# Patient Record
Sex: Male | Born: 1960 | Race: White | Hispanic: No | Marital: Single | Smoking: Never smoker
Health system: Southern US, Community
[De-identification: ages and names within clinical notes are randomized; demographics above are authoritative.]

## PROBLEM LIST (undated history)

## (undated) ENCOUNTER — Emergency Department (HOSPITAL_BASED_OUTPATIENT_CLINIC_OR_DEPARTMENT_OTHER): Payer: Medicare Other | Source: Home / Self Care

## (undated) DIAGNOSIS — E119 Type 2 diabetes mellitus without complications: Secondary | ICD-10-CM

## (undated) DIAGNOSIS — I2510 Atherosclerotic heart disease of native coronary artery without angina pectoris: Secondary | ICD-10-CM

## (undated) DIAGNOSIS — E663 Overweight: Secondary | ICD-10-CM

## (undated) DIAGNOSIS — E785 Hyperlipidemia, unspecified: Secondary | ICD-10-CM

## (undated) DIAGNOSIS — I10 Essential (primary) hypertension: Secondary | ICD-10-CM

## (undated) DIAGNOSIS — E039 Hypothyroidism, unspecified: Secondary | ICD-10-CM

## (undated) HISTORY — DX: Type 2 diabetes mellitus without complications: E11.9

## (undated) HISTORY — DX: Hyperlipidemia, unspecified: E78.5

## (undated) HISTORY — DX: Overweight: E66.3

## (undated) HISTORY — DX: Atherosclerotic heart disease of native coronary artery without angina pectoris: I25.10

## (undated) HISTORY — DX: Essential (primary) hypertension: I10

## (undated) HISTORY — PX: CORONARY ARTERY BYPASS GRAFT: SHX141

## (undated) HISTORY — DX: Hypothyroidism, unspecified: E03.9

## (undated) HISTORY — PX: ENUCLEATION: SHX628

---

## 1999-06-12 ENCOUNTER — Emergency Department (HOSPITAL_COMMUNITY): Admission: EM | Admit: 1999-06-12 | Discharge: 1999-06-12 | Payer: Self-pay | Admitting: Emergency Medicine

## 1999-06-12 ENCOUNTER — Encounter: Payer: Self-pay | Admitting: Emergency Medicine

## 2000-04-15 ENCOUNTER — Encounter: Admission: RE | Admit: 2000-04-15 | Discharge: 2000-05-30 | Payer: Self-pay | Admitting: Specialist

## 2000-07-04 ENCOUNTER — Encounter: Admission: RE | Admit: 2000-07-04 | Discharge: 2000-09-06 | Payer: Self-pay | Admitting: Specialist

## 2000-07-11 ENCOUNTER — Emergency Department (HOSPITAL_COMMUNITY): Admission: EM | Admit: 2000-07-11 | Discharge: 2000-07-11 | Payer: Self-pay | Admitting: Emergency Medicine

## 2000-07-11 ENCOUNTER — Encounter: Payer: Self-pay | Admitting: Emergency Medicine

## 2000-07-18 ENCOUNTER — Encounter: Payer: Self-pay | Admitting: Emergency Medicine

## 2000-07-18 ENCOUNTER — Emergency Department (HOSPITAL_COMMUNITY): Admission: EM | Admit: 2000-07-18 | Discharge: 2000-07-18 | Payer: Self-pay | Admitting: Emergency Medicine

## 2000-09-17 ENCOUNTER — Ambulatory Visit (HOSPITAL_COMMUNITY): Admission: EM | Admit: 2000-09-17 | Discharge: 2000-09-17 | Payer: Self-pay | Admitting: Emergency Medicine

## 2000-09-17 ENCOUNTER — Encounter: Payer: Self-pay | Admitting: Endocrinology

## 2000-10-13 ENCOUNTER — Emergency Department (HOSPITAL_COMMUNITY): Admission: EM | Admit: 2000-10-13 | Discharge: 2000-10-14 | Payer: Self-pay | Admitting: Emergency Medicine

## 2000-11-08 ENCOUNTER — Encounter: Admission: RE | Admit: 2000-11-08 | Discharge: 2001-02-06 | Payer: Self-pay | Admitting: *Deleted

## 2001-05-20 ENCOUNTER — Encounter: Admission: RE | Admit: 2001-05-20 | Discharge: 2001-06-03 | Payer: Self-pay | Admitting: Specialist

## 2001-07-11 ENCOUNTER — Emergency Department (HOSPITAL_COMMUNITY): Admission: EM | Admit: 2001-07-11 | Discharge: 2001-07-12 | Payer: Self-pay | Admitting: Emergency Medicine

## 2001-07-17 ENCOUNTER — Encounter: Admission: RE | Admit: 2001-07-17 | Discharge: 2001-07-17 | Payer: Self-pay | Admitting: Endocrinology

## 2001-07-17 ENCOUNTER — Encounter: Payer: Self-pay | Admitting: Endocrinology

## 2001-08-29 ENCOUNTER — Encounter: Payer: Self-pay | Admitting: Specialist

## 2001-08-29 ENCOUNTER — Ambulatory Visit (HOSPITAL_COMMUNITY): Admission: RE | Admit: 2001-08-29 | Discharge: 2001-08-29 | Payer: Self-pay | Admitting: *Deleted

## 2001-09-11 ENCOUNTER — Encounter: Admission: RE | Admit: 2001-09-11 | Discharge: 2001-10-09 | Payer: Self-pay | Admitting: *Deleted

## 2002-05-05 ENCOUNTER — Encounter: Admission: RE | Admit: 2002-05-05 | Discharge: 2002-06-25 | Payer: Self-pay | Admitting: *Deleted

## 2003-12-19 ENCOUNTER — Emergency Department (HOSPITAL_COMMUNITY): Admission: EM | Admit: 2003-12-19 | Discharge: 2003-12-19 | Payer: Self-pay | Admitting: *Deleted

## 2003-12-30 ENCOUNTER — Ambulatory Visit (HOSPITAL_COMMUNITY): Admission: RE | Admit: 2003-12-30 | Discharge: 2003-12-30 | Payer: Self-pay | Admitting: Orthopedic Surgery

## 2003-12-30 ENCOUNTER — Ambulatory Visit (HOSPITAL_BASED_OUTPATIENT_CLINIC_OR_DEPARTMENT_OTHER): Admission: RE | Admit: 2003-12-30 | Discharge: 2003-12-30 | Payer: Self-pay | Admitting: Orthopedic Surgery

## 2004-03-08 ENCOUNTER — Emergency Department (HOSPITAL_COMMUNITY): Admission: EM | Admit: 2004-03-08 | Discharge: 2004-03-08 | Payer: Self-pay | Admitting: Emergency Medicine

## 2004-04-30 ENCOUNTER — Emergency Department (HOSPITAL_COMMUNITY): Admission: EM | Admit: 2004-04-30 | Discharge: 2004-04-30 | Payer: Self-pay | Admitting: Emergency Medicine

## 2004-05-16 ENCOUNTER — Emergency Department (HOSPITAL_COMMUNITY): Admission: EM | Admit: 2004-05-16 | Discharge: 2004-05-16 | Payer: Self-pay | Admitting: Emergency Medicine

## 2005-08-16 ENCOUNTER — Ambulatory Visit: Payer: Self-pay | Admitting: Cardiology

## 2005-08-16 ENCOUNTER — Inpatient Hospital Stay (HOSPITAL_COMMUNITY): Admission: EM | Admit: 2005-08-16 | Discharge: 2005-08-18 | Payer: Self-pay | Admitting: Emergency Medicine

## 2005-08-27 ENCOUNTER — Ambulatory Visit: Payer: Self-pay | Admitting: Cardiology

## 2005-08-30 ENCOUNTER — Ambulatory Visit: Payer: Self-pay | Admitting: Cardiology

## 2005-10-11 ENCOUNTER — Emergency Department (HOSPITAL_COMMUNITY): Admission: EM | Admit: 2005-10-11 | Discharge: 2005-10-11 | Payer: Self-pay | Admitting: Emergency Medicine

## 2005-10-25 ENCOUNTER — Encounter (HOSPITAL_COMMUNITY): Admission: RE | Admit: 2005-10-25 | Discharge: 2005-12-29 | Payer: Self-pay | Admitting: Cardiology

## 2005-12-13 ENCOUNTER — Encounter: Payer: Self-pay | Admitting: Cardiology

## 2005-12-13 ENCOUNTER — Encounter: Payer: Self-pay | Admitting: Vascular Surgery

## 2005-12-13 ENCOUNTER — Inpatient Hospital Stay (HOSPITAL_COMMUNITY): Admission: EM | Admit: 2005-12-13 | Discharge: 2005-12-22 | Payer: Self-pay | Admitting: Emergency Medicine

## 2005-12-13 ENCOUNTER — Ambulatory Visit: Payer: Self-pay | Admitting: Cardiology

## 2005-12-14 ENCOUNTER — Encounter: Payer: Self-pay | Admitting: Vascular Surgery

## 2006-01-01 ENCOUNTER — Ambulatory Visit: Payer: Self-pay | Admitting: Internal Medicine

## 2006-01-10 ENCOUNTER — Encounter (HOSPITAL_COMMUNITY): Admission: RE | Admit: 2006-01-10 | Discharge: 2006-04-10 | Payer: Self-pay | Admitting: Cardiology

## 2006-02-26 ENCOUNTER — Ambulatory Visit: Payer: Self-pay | Admitting: Cardiology

## 2006-04-12 ENCOUNTER — Encounter (HOSPITAL_COMMUNITY): Admission: RE | Admit: 2006-04-12 | Discharge: 2006-05-24 | Payer: Self-pay | Admitting: Cardiology

## 2007-02-28 ENCOUNTER — Ambulatory Visit: Payer: Self-pay | Admitting: Cardiology

## 2008-07-07 DIAGNOSIS — I2510 Atherosclerotic heart disease of native coronary artery without angina pectoris: Secondary | ICD-10-CM

## 2008-07-07 DIAGNOSIS — E119 Type 2 diabetes mellitus without complications: Secondary | ICD-10-CM | POA: Insufficient documentation

## 2008-07-07 DIAGNOSIS — I10 Essential (primary) hypertension: Secondary | ICD-10-CM

## 2008-07-07 DIAGNOSIS — E785 Hyperlipidemia, unspecified: Secondary | ICD-10-CM | POA: Insufficient documentation

## 2008-07-07 DIAGNOSIS — E039 Hypothyroidism, unspecified: Secondary | ICD-10-CM | POA: Insufficient documentation

## 2008-07-07 HISTORY — DX: Type 2 diabetes mellitus without complications: E11.9

## 2008-07-07 HISTORY — DX: Atherosclerotic heart disease of native coronary artery without angina pectoris: I25.10

## 2008-07-07 HISTORY — DX: Hypothyroidism, unspecified: E03.9

## 2008-07-07 HISTORY — DX: Essential (primary) hypertension: I10

## 2008-07-07 HISTORY — DX: Hyperlipidemia, unspecified: E78.5

## 2008-07-08 ENCOUNTER — Ambulatory Visit: Payer: Self-pay | Admitting: Cardiology

## 2008-07-08 DIAGNOSIS — E663 Overweight: Secondary | ICD-10-CM

## 2008-07-08 HISTORY — DX: Overweight: E66.3

## 2008-09-29 ENCOUNTER — Encounter (INDEPENDENT_AMBULATORY_CARE_PROVIDER_SITE_OTHER): Payer: Self-pay | Admitting: *Deleted

## 2009-03-15 ENCOUNTER — Encounter: Payer: Self-pay | Admitting: Cardiology

## 2009-03-25 ENCOUNTER — Encounter: Payer: Self-pay | Admitting: Cardiology

## 2010-01-10 ENCOUNTER — Ambulatory Visit: Payer: Self-pay | Admitting: Cardiology

## 2010-03-24 ENCOUNTER — Encounter: Payer: Self-pay | Admitting: Cardiology

## 2010-04-11 NOTE — Assessment & Plan Note (Signed)
Summary: 14month f/u per pt call/lg   Visit Type:  Follow-up Primary Provider:  Dr. Evlyn Kanner  CC:  CAD.  History of Present Illness: The patient presents for followup of his known coronary disease. He has done well since I last saw him. I reviewed lipids done earlier this year and they were excellent with an HDL of 50 and an LDL of 33. He has had no chest discomfort, neck or arm discomfort. He has had none of the nausea or stomach upset there was his angina. He has had no new shortness of breath, PND or orthopnea. He is not describing palpitations, presyncope or syncope. Unfortunately he is not exercising despite the fact that he has a treadmill and exercise bike in his house.  Current Medications (verified): 1)  Gabapentin 600 Mg Tabs (Gabapentin) .... Take 1 Tablet By Mouth Four Times A Day 2)  Aspirin 81 Mg Tbec (Aspirin) .... Take One Tablet By Mouth Daily 3)  Bupropion Hcl 300 Mg Xr24h-Tab (Bupropion Hcl) .... Take 1 Tablet By Mouth Once A Day 4)  Metoprolol Tartrate 50 Mg Tabs (Metoprolol Tartrate) .... Take 1 Tablet By Mouth Two Times A Day 5)  Benazepril Hcl 10 Mg Tabs (Benazepril Hcl) .... Take 1 Tablet By Mouth Once A Day 6)  Lipitor 80 Mg Tabs (Atorvastatin Calcium) .... Take One Tablet By Mouth Daily. 7)  Synthroid 125 Mcg Tabs (Levothyroxine Sodium) .Marland Kitchen.. 1 By Mouth Daily 8)  Centrum Silver  Tabs (Multiple Vitamins-Minerals) .... Take 1 Tablet By Mouth Once A Day 9)  Lantus 100 Unit/ml Soln (Insulin Glargine) .... As Directed 10)  Novolog Sliding Scale .... As Directed  Allergies (verified): 1)  ! Cipro 2)  ! Penicillin  Past History:  Past Medical History: Reviewed history from 07/07/2008 and no changes required. 1. Coronary artery disease (coronary artery bypass grafting December 13, 2005 with a left internal mammary artery to the left anterior       descending, saphenous vein graft to posterior descending artery,       saphenous vein graft to obtuse marginal, and  saphenous vein graft       to diagonal).   2. Mildly reduced ejection fraction (ejection fraction about 45%).   3. Diabetes mellitus since the age of 7.   4. Hypertension.   5. Dyslipidemia.   6. Hypothyroidism.   Past Surgical History: Laser therapy for diabetic retinopathy Left enuclation July 2009 CABG  Review of Systems       As stated in the HPI and negative for all other systems.   Vital Signs:  Patient profile:   50 year old male Height:      71 inches Weight:      233 pounds BMI:     32.61 Pulse rate:   68 / minute Resp:     18 per minute BP sitting:   126 / 82  (right arm)  Vitals Entered By: Marrion Coy, CNA (January 10, 2010 9:23 AM)  Physical Exam  General:  Well developed, well nourished, in no acute distress. Head:  normocephalic and atraumatic Mouth:  Teeth, gums and palate normal. Oral mucosa normal. Neck:  Neck supple, no JVD. No masses, thyromegaly or abnormal cervical nodes. Chest Wall:  well-healed sternotomy scar Lungs:  Clear bilaterally to auscultation and percussion. Heart:  Non-displaced PMI, chest non-tender; regular rate and rhythm, S1, S2 without murmurs, rubs or gallops. Carotid upstroke normal, no bruit. Normal abdominal aortic size, no bruits.  Femorals normal pulses, no bruits. Pedals normal pulses. Trace edema, no varicosities. Abdomen:  Bowel sounds positive; abdomen soft and non-tender without masses, organomegaly, or hernias noted. No hepatosplenomegaly, obese Msk:  Back normal, normal gait. Muscle strength and tone normal. Extremities:  mild bilateral edema Neurologic:  Alert and oriented x 3. Skin:  Intact without lesions or rashes. Cervical Nodes:  no significant adenopathy Inguinal Nodes:  no significant adenopathy Psych:  Normal affect.    EKG  Procedure date:  01/10/2010  Findings:      Sinus rhythm, rate 69, right axis deviation, poor anterior R-wave progression, nonspecific T-wave flattening  Impression &  Recommendations:  Problem # 1:  CAD (ICD-414.00) He has had no new cardiovascular symptoms. He is anticipating for the most part in good risk reduction. He has close followup by Dr. Evlyn Kanner. At this point no further cardiovascular testing is suggested though I probably will perform an exercise treadmill when I see him next as his bypass grafts will be over 74 years old. We also discussed any symptoms that could prompt further cardiac work up sooner than this. Orders: EKG w/ Interpretation (93000)  Problem # 2:  OVERWEIGHT (ICD-278.02) We had a long discussion about weight loss with specifics on diet and exercise. He understands his body mass index puts him in the obese range.  Problem # 3:  HYPERTENSION (ICD-401.9) His blood pressure is controlled. He will continue the meds as listed.  Patient Instructions: 1)  Your physician recommends that you schedule a follow-up appointment in: 18 months with Dr Antoine Poche:  you should receive a reminder 2 months prior to your due date.  Please call to schedule. 2)  Your physician recommends that you continue on your current medications as directed. Please refer to the Current Medication list given to you today.

## 2010-04-28 ENCOUNTER — Emergency Department (HOSPITAL_BASED_OUTPATIENT_CLINIC_OR_DEPARTMENT_OTHER)
Admission: EM | Admit: 2010-04-28 | Discharge: 2010-04-28 | Disposition: A | Discharge status: Home / Self Care | Payer: Medicare Other | Attending: Emergency Medicine | Admitting: Emergency Medicine

## 2010-04-28 ENCOUNTER — Emergency Department (INDEPENDENT_AMBULATORY_CARE_PROVIDER_SITE_OTHER): Payer: Medicare Other

## 2010-04-28 DIAGNOSIS — R51 Headache: Secondary | ICD-10-CM | POA: Insufficient documentation

## 2010-04-28 DIAGNOSIS — G8929 Other chronic pain: Secondary | ICD-10-CM | POA: Insufficient documentation

## 2010-04-28 DIAGNOSIS — E119 Type 2 diabetes mellitus without complications: Secondary | ICD-10-CM | POA: Insufficient documentation

## 2010-04-28 DIAGNOSIS — I10 Essential (primary) hypertension: Secondary | ICD-10-CM | POA: Insufficient documentation

## 2010-04-28 DIAGNOSIS — E7800 Pure hypercholesterolemia, unspecified: Secondary | ICD-10-CM | POA: Insufficient documentation

## 2010-04-28 DIAGNOSIS — Z79899 Other long term (current) drug therapy: Secondary | ICD-10-CM | POA: Insufficient documentation

## 2010-04-28 DIAGNOSIS — H5440 Blindness, one eye, unspecified eye: Secondary | ICD-10-CM | POA: Insufficient documentation

## 2010-04-28 DIAGNOSIS — K029 Dental caries, unspecified: Secondary | ICD-10-CM | POA: Insufficient documentation

## 2010-04-28 LAB — DIFFERENTIAL
Basophils Absolute: 0 10*3/uL (ref 0.0–0.1)
Lymphocytes Relative: 18 % (ref 12–46)
Lymphs Abs: 1.5 10*3/uL (ref 0.7–4.0)
Monocytes Absolute: 0.5 10*3/uL (ref 0.1–1.0)
Neutro Abs: 5.8 10*3/uL (ref 1.7–7.7)

## 2010-04-28 LAB — BASIC METABOLIC PANEL
CO2: 28 mEq/L (ref 19–32)
Glucose, Bld: 259 mg/dL — ABNORMAL HIGH (ref 70–99)
Potassium: 4.5 mEq/L (ref 3.5–5.1)
Sodium: 141 mEq/L (ref 135–145)

## 2010-04-28 LAB — CBC
HCT: 40.5 % (ref 39.0–52.0)
Hemoglobin: 14.2 g/dL (ref 13.0–17.0)
MCHC: 35.1 g/dL (ref 30.0–36.0)
WBC: 8.2 10*3/uL (ref 4.0–10.5)

## 2010-07-25 NOTE — Assessment & Plan Note (Signed)
[facility]                            CARDIOLOGY OFFICE NOTE   NAME:[name], [name]                       MRN:          [id number]  DATE:02/28/2007                            DOB:          Jul 02, 1960    PRIMARY CARE PHYSICIAN:  Dr. Adrian Prince.   REASON FOR PRESENTATION:  Evaluate patient with coronary disease, status  post coronary artery bypass grafting.   HISTORY OF PRESENT ILLNESS:  The patient returns for 75-month followup.  He is 50 years old.  Since I last saw him, he has had no cardiovascular  problems.  He said he was not exercising as much as he used to because  he was battling depression.  However, this seems to be better treated  now.  He is starting to walk.  He is trying to walk a half hour a day.  With this, he denies any chest discomfort, neck or arm discomfort.  He  has had no palpitations, presyncope, or syncope.  He has had no PND or  orthopnea.  He had his lipids checked by Dr. Evlyn Kanner recently and says  they were excellent.   PAST MEDICAL HISTORY:  1. Coronary artery disease (coronary artery bypass grafting December 13, 2005 with a left internal mammary artery to the left anterior      descending, saphenous vein graft to posterior descending artery,      saphenous vein graft to obtuse marginal, and saphenous vein graft      to diagonal).  2. Mildly reduced ejection fraction (ejection fraction about 45%).  3. Diabetes mellitus since the age of 7.  4. Hypertension.  5. Dyslipidemia.  6. Hypothyroidism.  7. 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  8. Laser therapy for diabetic retinopathy.  9. Shoulder adhesive capsulitis.   ALLERGIES:  PENICILLIN.   MEDICATIONS:  1. Gabapentin 600 mg q.i.d.  2. Synthroid 100 mcg daily.  3. Multivitamin.  4. Lantus.  5. Metoprolol 50 mg b.i.d.  6. Lipitor 80 mg nightly.  7. Aspirin 81 mg daily.  8. Bupropion 300 mg daily.  9. Benazepril 10 mg daily.   REVIEW OF SYSTEMS:  As stated in the HPI and otherwise negative for  other systems.   PHYSICAL EXAMINATION:  The patient is in no distress.  Blood pressure 121/78, heart rate 75 and regular, weight 211 pounds,  body mass index 27.  NECK:  No jugular venous distension at 45 degrees, carotid upstroke  brisk and symmetrical.  No bruit.  No thyromegaly.  LYMPHATICS:  No adenopathy.  LUNGS:  Clear to auscultation bilaterally.  CHEST:  Well-healed sternotomy scar.  HEART:  PMI not displaced or sustained.  S1 and S2 are within normal  limits.  No S3, no S4.  No clicks, no rubs, no murmurs.  ABDOMEN:  Obese, positive bowel sounds, normal in frequency and pitch.  No bruits, no rebound, no guarding.  No midline pulsatile mass.  No  organomegaly.  SKIN:  No rashes, no nodules.  EXTREMITIES:  2+ pulses, no edema.   EKG sinus rhythm, rate  75, right axis deviation, limb lead reversal, old  anteroseptal myocardial infarction, intervals within normal limits, no  acute ST-T wave changes.   ASSESSMENT AND PLAN:  1. Coronary disease:  The patient is having no symptoms.  He has done      well since surgery.  He is having secondary risk reduction.  No      further cardiovascular testing is suggested.  2. Dyslipidemia:  Per Dr. Evlyn Kanner.  3. Followup:  I will see the patient back in 18 months or sooner if      needed.     Rollene Rotunda, MD, [facility]  Electronically Signed    JH/MedQ  DD: 02/28/2007  DT: 03/01/2007  Job #: [id number]

## 2010-07-28 NOTE — Op Note (Signed)
NAME:  [name], [name]                ACCOUNT NO.:  [id number]   MEDICAL RECORD NO.:  [id number]          PATIENT TYPE:  AMB   LOCATION:  DSC                          FACILITY:  [facility]   PHYSICIAN:  Katy Fitch. Sypher Montez Hageman., M.D.DATE OF BIRTH:  09-18-1960   DATE OF PROCEDURE:  12/30/2003  DATE OF DISCHARGE:                                 OPERATIVE REPORT   PREOPERATIVE DIAGNOSIS:  Chronic entrapment neuropathy median nerve, right  carpal tunnel.   POSTOPERATIVE DIAGNOSIS:  Chronic entrapment neuropathy median nerve, right  carpal tunnel.   OPERATION:  Release of right transverse carpal ligament.   SURGEON:  Katy Fitch. Sypher, M.D.   ASSISTANT:  Jonni Sanger, P.A.-C.   ANESTHESIA:  General by LMA, anesthesiologist Dr. Krista Blue.   INDICATIONS:  [name] is a 50 year old right hand dominant gentleman  with long-standing insulin-dependent diabetes mellitus.  He was referred by  Dr. Evlyn Kanner for evaluation and management of hand pain and numbness consistent  with carpal tunnel syndrome.   Clinical examination revealed signs of loss of sensibility in the median  distribution of the right hand consistent with carpal tunnel syndrome  superimposed on a background diabetic neuropathy.   Dr. Johna Roles performed detailed electrodiagnostic studies which revealed  evidence of rather significant right carpal tunnel syndrome.   Due to a failure to respond to nonoperative measures, he was brought to the  operating room at this time for release of his right transverse carpal  ligament.   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  It is likely that he will  have issues with scar formation and even the development of a Dupuytren's  contracture following surgery given his background palmar fibromatosis.   DESCRIPTION OF PROCEDURE:  [name] was brought to the operating room  and placed in the supine position on  the operating table.  Following  induction of general anesthesia by LMA, the right arm was prepped with  Betadine soap and solution and sterilely draped.   Following exsanguination of the right arm with an Esmarch bandage, an  arterial tourniquet on the proximal brachium was inflated to 220 mmHg.   The procedure commenced with a short incision in the line of the ring finger  of the palm.  The subcutaneous tissues were carefully divided to reveal the  palmar fascia.  This was split longitudinally to the commissure branch of  the median nerve.  These were followed back to the transverse carpal  ligament which was then carefully isolated from the median nerve.   The ligament was released with scissors along its ulnar border, releasing  rather dramatic tension in the carpal canal.  The ulnar bursa was quite  fibrotic.   Bleeding points were electrocauterized with bipolar current, followed by  repair of the skin with intradermal 3-0 Prolene suture.   A compressive dressing was applied with a volar plaster splint, maintaining  the wrist in 5 degrees of dorsiflexion.   There were no apparent complications.   [name] tolerated surgery and anesthesia well.  He was  transferred to the  recovery room with stable vital signs.   He will be discharged with a prescription for Percocet 5 mg one tablet p.o.  q.4-6h. p.r.n. pain, a total of 20 tablets without refill.  He was also  given Levaquin 500 mg one p.o. daily x4 days as a prophylactic antibiotic.      Robe   RVS/MEDQ  D:  12/30/2003  T:  12/30/2003  Job:  [id number]

## 2010-07-28 NOTE — Consult Note (Signed)
NAME:  [name], [name] NO.:  [id number]   MEDICAL RECORD NO.:  [id number]          PATIENT TYPE:  INP   LOCATION:  2013                         FACILITY:  [facility]   PHYSICIAN:  Kerin Perna, M.D.  DATE OF BIRTH:  1960/10/03   DATE OF PROCEDURE:  12/13/2005  DATE OF DISCHARGE:                      STAT - MUST CHANGE TO CORRECT WORK TYPE   PRIMARY CARE PHYSICIAN:  Tera Mater. Evlyn Kanner, M.D.   CONSULTANT:  Kerin Perna, M.D.   REASON FOR CONSULTATION:  Acute subendocardial myocardial infarction with  severe 3-vessel coronary artery disease and unstable angina.   CHIEF COMPLAINT:  Chest pain.   HISTORY OF PRESENT ILLNESS:  I was asked to evaluate this 50 year old white  male diabetic for potential surgical coronary revascularization for known  severe 3-vessel coronary artery disease with recent acute subendocardial MI.  The patient was hospitalized with some anginal symptoms last summer and  cardiac cath demonstrated 3-vessel disease with nonobstructive disease of  the LAD. For that reason medical therapy was recommended. The patient  presented yesterday with sustained chest pain that lasted for several hours.  It was in the anterior chest with radiation to the shoulders and similar to  the pain he had on his presentation earlier last summer.  He did developed  associated nausea and emesis with a headache and dizziness and weakness. The  patient presented to the emergency department where he was found to have  positive cardiac enzymes and his CPK-MB increased from 30.7 to 160  nanogram/mL. He was admitted and placed on IV heparin and a 2D echo was  performed showing anterior apical hypokinesia. He subsequently underwent  cardiac catheterization today because he ruled in for MI, and this  demonstrated progression in his coronary disease with a 70-80% stenosis of  the LAD with a high-grade proximal stenosis of the ramus intermedius and a  high-grade stenosis of the  distal right coronary of 90%. LVEDP was 26 and  ejection fraction was 45%.  There was no evidence of mitral regurgitation on  the echo or the LV gram. Because of his anatomy and recent rule in for MI he  was felt to be a potential candidate for surgical revascularization.   PAST MEDICAL HISTORY:  1. Insulin-dependent diabetes.  2. Hypertension.  3. Hypothyroidism.  4. Blindness in the left eye from retinal hemorrhage.  5. 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  6. ALLERGY TO PENICILLIN - which causes a rash.   HOME MEDICATIONS:  1. Aspirin 81 mg.  2. Lopressor 50 b.i.d.  3. Pravachol 80 mg q.h.s.  4. Nitroglycerin p.r.n.  5. Lotensin 120 mg daily.  6. Neurontin 600 mg q.a.m., 1200 mg 1 p.m., 600 mg at h.s.  7. Synthroid 100 mcg daily.  8. Insulin NPH 22 units q.a.m., 20 units q.p.m.  9. Lantus insulin 38 units q.h.s.   SOCIAL HISTORY:  The patient is disabled and lives by himself. He recently  lost his father which has caused a significant emotional affect. He does not  smoke cigarettes or use alcohol.  He has other siblings. He is able to  drive.  FAMILY HISTORY:  Positive for diabetes. Negative for premature coronary  artery disease although his father did have cardiac arrhythmias prior to his  death.   REVIEW OF SYSTEMS:  CONSTITUTIONAL - review is negative for fever, weight  loss.  ENT - review is positive for his retinal disease and retinal surgery  bilaterally.  He does not see his dentist regularly.  He denies any difficulty swallowing.  THORACIC - review is negative for a history of abnormal chest x-ray,  pulmonary nodule or significant thoracic trauma.  CARDIAC -  review is  positive for his severe coronary disease and recent subendocardial MI,  anterior apical.  GI - review is positive for GERD, negative for gallstones,  hepatitis, jaundice or blood per rectum.  UROLOGIC- review is negative for  BPH or hematuria.  ENDOCRINE - review is positive for diabetes and   hypothyroidism. VASCULAR -  review is negative for DVT, claudication or TIA.  NEUROLOGIC - review is negative for stroke or seizure.  HEMATOLOGIC - review  is negative for bleeding disorder or prior blood transfusion.   PHYSICAL EXAMINATION:  VITAL SIGNS:  The patient is 5 feet, 10 inches and  weight is 200 pounds.  Blood pressure is 130/60, pulse 70, respirations 18.  GENERAL APPEARANCE:  Is that of a chronically ill white male in his hospital  room following cardiac catheterization, in no distress.  HEENT EXAM:  Normocephalic. Poor vision and blind in the left eye. Dentition  adequate.  NECK:  Without JVD, mass or carotid bruit.  LYMPHATICS:  Reveal no palpable supraclavicular or axillary adenopathy.  CHEST:  His breath sounds are clear and equal and there is no thoracic  deformity.  CARDIAC EXAM:  Reveals a regular rhythm without an S3 gallop or a murmur.  ABDOMEN:  Soft without pulsatile mass or focal tenderness.  EXTREMITIES:  Show no clubbing or cyanosis with 1+ ankle edema.  Peripheral  pulses are 2+ in the upper extremities and femoral's, 1+ pedal pulses.  NEUROLOGIC EXAM:  Alert and oriented without focal motor deficit. His gait  is not examined at this time since he is at bedrest following cardiac  catheterization.   LABORATORY DATA:  I reviewed the coronary arteriograms, LV gram and 2-D  echocardiogram. The patient has severe diabetic pattern coronary disease  with a recent myocardial infarction. He has moderate reduction of LV  function.   PLAN:  The patient would be a candidate for a high risk surgical  revascularization due to his poor targets from diffuse diabetic pattern  disease. However this is his best option.  Surgery will be scheduled for  Monday, December 17, 2005.  I reviewed the situation and operation with the  patient and at this time he is agreeable to proceed.   Thank you for the consultation.      Kerin Perna, M.D.  Electronically  Signed    PV/MEDQ  D:  12/13/2005  T:  12/13/2005  Job:  [id number]   cc:   Jeannett Senior A. Evlyn Kanner, M.D.

## 2010-07-28 NOTE — Assessment & Plan Note (Signed)
[facility]                              CARDIOLOGY OFFICE NOTE   NAME:[name]                       MRN:          [id number]  DATE:01/01/2006                            DOB:          August 25, 1960    This is a 50 year old white male patient, who presented with acute  subendocardial MI and was found to have severe three-vessel coronary artery  disease and underwent CABG x4 on December 17, 2005.  He had a LIMA to the LAD,  SVG to the PDA, SVG to the OM and SVG to the diagonal.  Ejection fraction  preop was 45% with anterior apical hypokinesis.  The patient did well  postop.  His biggest problem is his insulin-dependent diabetes mellitus and  getting his sugars back under control.  He has some chest soreness but  overall he is not having significant pain, shortness of breath, dizziness or  pre-syncope.   CURRENT MEDICATIONS:  1. Gabapentin 600 mg q.i.d.  2. Benazepril HCL 120 mg daily.  3. Synthroid 100 mcg daily.  4. Multivitamin daily.  5. Lantus Humulin and Humalog insulin.  6. Metoprolol 50 mg b.i.d.  7. Lipitor 80 mg q.h.s.  8. Lexapro 20 mg daily.  9. Plavix 75 mg daily.   PHYSICAL EXAMINATION:  GENERAL:  A pleasant 50 year old white male in no  acute distress.  VITAL SIGNS:  Blood pressure 100/64.  Pulse 81.  NECK:  Without JVD, HR, bruit or thyroid enlargement.  LUNGS:  Clear anterior, posterior and lateral.  HEART:  Regular rate and rhythm at 81 beats per minute.  Normal S1 and S2.  Positive S4.  No significant murmur heard.  ABDOMEN:  Soft without organomegaly, masses, lesions or abnormal tenderness.  CHEST:  Incision is healing well.  EXTREMITIES:  Without cyanosis or clubbing.  He has mild left ankle edema.   EKG normal sinus rhythm with inferior Q-waves and poor R-wave progression.  Nonspecific ST-T changes.   IMPRESSION:  1. Coronary artery disease, status post coronary artery bypass grafting x4      on December 17, 2005  after subendocardial myocardial infarction.  2. Insulin-dependent diabetes mellitus with diabetic retinopathy,      neuropathy.  3. Hypertension.  4. Hypothyroidism.  5. Hyperlipidemia.  6. History of blindness of the left eye secondary to retinal hemorrhage.  7. Shoulder adhesive capsulitis.  8. Carpal tunnel syndrome.   PLAN:  At this time, the patient is doing well from a cardiac standpoint.  He sees the surgeons back on January 11, 2006 and will follow up with Dr.  Antoine [name] in 1-2 months.     ______________________________  Jacolyn Reedy, PA-C    ______________________________  Bevelyn Buckles. Bensimhon, MD   ML/MedQ  DD: 01/01/2006  DT: 01/02/2006  Job #: [id number]   cc:   Kerin Perna, M.D.  Tera Mater. Evlyn Kanner, M.D.

## 2010-07-28 NOTE — Cardiovascular Report (Signed)
NAME:  FRIEND, DORFMAN NO.:  [id number]   MEDICAL RECORD NO.:  [id number]          PATIENT TYPE:  INP   LOCATION:  2013                         FACILITY:  [facility]   PHYSICIAN:  Veverly Fells. Excell Seltzer, MD  DATE OF BIRTH:  March 08, 1961   DATE OF PROCEDURE:  12/13/2005  DATE OF DISCHARGE:                              CARDIAC CATHETERIZATION   PROCEDURES:  1. Left heart catheterization.  2. Selective coronary angiography.  3. Left ventricular angiography.  4. Angio-Seal of the right femoral artery.   INDICATIONS:  [name] is a very pleasant, 50 year old man with severe 3-  vessel coronary artery disease, who underwent a cardiac catheterization in  June of 2007.  Because he has diffuse involvement of all 3 coronaries, he  was treated medically.  He had done fairly well with medical therapy until 2  days prior to admission when he developed chest pain.  He presented for  evaluation and ruled in for myocardial infarction.  He was subsequently  referred for cardiac catheterization.   PROCEDURAL DETAILS:  Risks and indications of the procedure were explained  to the patient.  Informed consent was obtained.  Under normal sterile  conditions, the right groin was prepped, draped and anesthetized with 1%  lidocaine.  Using the modified Seldinger technique a 6-French arterial  sheath was placed in the right femoral artery.  Multiple angiographic views  were taken of both the left and right coronary arteries.  For the left  coronary artery, a JL4 catheter was used.  For the right coronary artery, a  JR4 catheter was used.  Following selective coronary angiography, a pigtail  catheter was inserted into the left ventricle and left ventricular pressures  were recorded.  A 30-degree, right anterior oblique left ventriculogram was  performed.  Following left ventriculography a pullback across the aortic  valve was performed.  All catheter exchanges were performed a guidewire.  At  the  conclusion of the case, a 6-French Angio-Seal closure device was used to  close the right common femoral arteriotomy site.   FINDINGS:  Aortic pressure 100/59 with mean of 78.  Left ventricular  pressure 102/16 with an end-diastolic pressure of 26.   The left mainstem has nonobstructive plaque disease.  It bifurcates into the  LAD and left circumflex.  The left mainstem is mildly calcified.   Left anterior descending is moderately calcified throughout its proximal and  mid segments.  There is diffuse disease throughout the LAD most  significantly in the proximal and mid vessel.  There is a very long area of  disease with 70% serial stenoses throughout.  The LAD is a medium caliber  vessel distally and courses around to supply the left ventricular apex.  There are 2 diagonal branches in the mid LAD that are diffusely diseased.   The left circumflex is calcified. There is nonobstructive disease throughout  the proximal left circumflex.  There is a first obtuse marginal that covers  the territory of a ramus intermedius.  This vessel has high-grade disease  throughout its proximal segments in the range of 90%.  Distally  this is a  medium caliber vessel that bifurcates into twin vessels.   There is a second obtuse marginal that is subtotally occluded.  The true  circumflex courses down the A-V groove and gives off 3 medium-caliber  posterolateral branches, all of which have diffuse moderate disease.   The right coronary artery is co-dominant.  There is a very large RV marginal  branch that has mild diffuse disease. The mid right coronary artery has an  80% lesion in its mid segment.  It is then subtotally occluded in its distal  segment just prior to the PDA.  The PDA has diffuse disease including an 80%  focal stenosis.  There is a right posterolateral branch that is subtotally  occluded.   Left ventriculography performed in the 30-degree right anterior oblique  projection demonstrates  mild left ventricular systolic dysfunction with  hypokinesis of the anteroapex, as well as the inferior wall.  The estimated  left ventricular ejection fraction is 40%.   ASSESSMENT:  1. Severe, diffuse 3-vessel coronary artery disease with small coronary      arteries.  2. Mild left ventricular dysfunction with elevated left ventricular end-      diastolic pressure.   PLAN:  The patient is clearly not a candidate for percutaneous intervention.  He has severe, diffuse coronary disease.  He has failed medical management.  We have requested a CVTS consultation for consideration of coronary bypass  surgery.   His right femoral artery was sealed with an Angio-Seal closure device.  We  will restart heparin with no bolus, approximately 6 hours after his Angio-  Seal was placed.      Veverly Fells. Excell Seltzer, MD  Electronically Signed     MDC/MEDQ  D:  12/13/2005  T:  12/14/2005  Job:  [id number]

## 2010-07-28 NOTE — Assessment & Plan Note (Signed)
[facility]                            CARDIOLOGY OFFICE NOTE   NAME:[name], [name]                       MRN:          [id number]  DATE:02/26/2006                            DOB:          Jun 27, 1960    PRIMARY:  Dr. Adrian Prince.   REASON FOR PRESENTATION:  Evaluate the patient with coronary disease  status post CABG.   HISTORY OF PRESENT ILLNESS:  The patient is a 50 year old gentleman with  recent subendocardial myocardial infarction.  He is doing well status  post CABG.  Denies any chest discomfort.  He is participating in cardiac  rehab.  He denies any neck or arm discomfort.  He is having no shortness  of breath, PND, or orthopnea.  He is having no palpitations, pre-  syncope, or syncope.   CURRENT MEDICATIONS:  Gabapentin, benazepril 20 mg daily, Synthroid 100  mcg daily, multivitamin, Lantus, metoprolol 50 mg b.i.d., Lipitor 80 mg  nightly, Lexapro 20 mg a day, aspirin 81 mg daily.   REVIEW OF SYSTEMS:  As stated in the HPI, and otherwise negative for  other systems.   PHYSICAL EXAMINATION:  The patient is in no distress.  Blood pressure 120/64, heart rate 80 and regular.  Weight 195 pounds,  body mass index 26.  HEENT:  Eyes unremarkable.  Pupils are equal, round, and reactive to  light.  Fundi not visualized.  Oral mucosa unremarkable.  NECK:  No jugular venous distension, wave form within normal limits,  carotid upstroke brisk and symmetric, no bruits, thyromegaly.  LYMPHATICS:  No adenopathy.  LUNGS:  Clear to auscultation bilaterally.  BACK:  No costovertebral angle tenderness.  CHEST:  Well-healed sternotomy scar.  HEART:  PMI not displaced or sustained, S1 and S2 within normal limits,  no S3, no S4, no murmurs.  ABDOMEN:  Flat, positive bowel sounds, normal in frequency and pitch, no  bruits, rebound, guarding.  No midline pulsatile masses, hepatomegaly,  splenomegaly.  SKIN:  No rashes, no nodules.  EXTREMITIES:  With 2+  pulses throughout, no edema, cyanosis, clubbing.  NEURO:  Grossly intact throughout.   ASSESSMENT AND PLAN:  1. Coronary artery disease.  The patient is doing well with respect to      this.  He has made a nice recovery from bypass surgery.  He is      participating in secondary risk reduction.  He is participating in      cardiac rehab.  At this point, I will make no change to his      regimen.  2. Risk reduction.  He needs to have his lipids checked.  I have      encouraged him to call Dr. Evlyn Kanner for a followup, as this is due.      He can get his blood work done fasting and have this      followed by Dr. Evlyn Kanner with a goal LDL less than 70 and an HDL in      the 50s.  3. I will see him back in 12 months or sooner if needed.     Fayrene Fearing  Antoine Poche, MD, [facility]  Electronically Signed    JH/MedQ  DD: 02/26/2006  DT: 02/26/2006  Job #: [id number]   cc:   Jeannett Senior A. Evlyn Kanner, M.D.

## 2010-07-28 NOTE — Op Note (Signed)
NAME:  [name], [name] NO.:  [id number]   MEDICAL RECORD NO.:  [id number]          PATIENT TYPE:  INP   LOCATION:  2312                         FACILITY:  [facility]   PHYSICIAN:  Kerin Perna, M.D.  DATE OF BIRTH:  Jan 23, 1961   DATE OF PROCEDURE:  12/17/2005  DATE OF DISCHARGE:                                 OPERATIVE REPORT   OPERATION:  Coronary artery bypass grafting x4 (left internal mammary artery  to the LAD, saphenous vein graft to ramus intermediate, saphenous vein graft  to circumflex marginal, saphenous vein graft to posterior descending).   PREOPERATIVE DIAGNOSIS:  Class IV unstable angina, status post non-Q-wave  MI.   POSTOPERATIVE DIAGNOSIS:  Class IV unstable angina, status post non-Q-wave  MI.   SURGEON:  Kerin Perna, M.D.   ASSISTANT:  Tressie Stalker, M.D. and Lujean Rave, PA-C.   ANESTHESIA:  General.   INDICATIONS:  The patient is a 50 year old insulin-dependent diabetic with  severe retinopathy and neuropathy who presented with post infarction  unstable angina.  Cardiac catheterization demonstrated severe three-vessel  coronary disease with ejection fraction of 40%.  Surgical revascularization  was recommended for the patient.  I examined him in his hospital room on  several occasions reviewing the results of the cardiac cath and the  situation concerning his heart disease.  I discussed the indications and  benefits of surgical coronary revascularization with Mr. Ramesh as well as  the alternatives to surgical therapy.  I reviewed with him the major aspects  including the choice of conduit, the location of the surgical incisions, use  of general anesthesia and cardiopulmonary bypass, and the expected  postoperative hospital recovery.  I also discussed with him the specific  risks of this operation including the risks of MI, CVA, bleeding, infection,  and death.  He understood that he had severe diabetic disease with poor  targets for grafting and that had his risk would be greater than the normal  risk for this operation.  After reviewing this information with the patient,  he demonstrated his understanding and agreed to proceed with surgery under  what I felt was an informed consent.   OPERATIVE FINDINGS:  The patient's coronaries were severely and diffusely  diseased, poor targets, and would not be candidate for redo surgery.  The  saphenous vein was harvested endoscopically from the right leg.  The  internal mammary artery was a good vessel with excellent flow.  The patient  was given the aprotinin protocol for this operation to reduce bleeding and  blood transfusion requirement.  The patient's glucose was controlled  intraoperatively with an IV insulin drip and protocol.   DESCRIPTION OF PROCEDURE:  The patient was brought to the operating room,  placed supine on the operating table, general anesthesia was induced under  invasive hemodynamic monitoring.  The chest, abdomen and legs were prepped  with Betadine and draped as a sterile field.  A sternal incision was made as  the saphenous vein was harvested endoscopically from the right leg.  The  left internal mammary artery was harvested as a pedicle  graft from its  origin at the subclavian vessels.  The sternal retractor was then placed and  the pericardium opened and suspended.  Heparin was administered and the ACT  was documented as being therapeutic.  Pursestrings were placed in the  ascending aorta and right atrium and the patient was cannulated and placed  on bypass.  The coronaries were identified for grafting.  The mammary artery  and vein grafts were prepared for the distal anastomoses and cardioplegia  catheters were placed for both antegrade aortic and retrograde coronary  sinus cardioplegia.  The patient was cooled to 32 degrees and the aortic  crossclamp was applied.  800 mL of cold blood cardioplegia was delivered in  split doses between  the antegrade aortic and retrograde coronary sinus  catheters.  There was good cardioplegic arrest and septal temperature  dropped less than 15 degrees.  Topical iced saline slush was used to augment  myocardial preservation.   The distal coronary anastomoses were then performed.  The first distal  anastomosis was to the posterior descending.  This was with a 95% proximal  stenosis.  A reverse saphenous vein was sewn end-to-side with running 7-0  Prolene.  There was adequate flow through the graft.  The second distal  anastomosis was to the ramus branch of the left coronary.  This is a 1.5-mm  vessel and had a proximal 95% stenosis.  A reverse saphenous vein was sewn  end-to-side with running 7-0 Prolene.  There was adequate flow through the  graft.  Cardioplegia was redosed.  The third distal anastomosis was to the  circumflex marginal branch of the left coronary.  This was a 1.5-mm vessel  heavily diseased with proximal 90% stenosis.  A reverse saphenous vein was  sewn end-to-side with running 7-0 Prolene and  there was good flow through  the graft.  Cardioplegia was redosed.  The fourth distal anastomosis was to  the mid portion of the LAD.  This was a 1.7-mm vessel with a proximal 90%  stenosis.  The left IMA pedicle was brought through an opening created in  the left lateral pericardium and was brought down onto the LAD and sewn end-  to-side with running 8-0 Prolene.  There was excellent flow through the  anastomosis with immediate rise in septal temperature after a brief release  of the pedicle bulldog clamp.  The bulldog was reapplied and the pedicle  secured to the epicardium.   Cardioplegia was redosed.  While the crossclamp was still in place, three  proximal vein anastomoses were performed on the ascending aorta using a 4.0-  mm punch with running 6-0 Prolene.  Prior to tying down the final proximal  anastomosis, air was vented from the coronaries and the left side of  the heart using a dose of retrograde warm blood cardioplegia and the usual de-  airing maneuvers on bypass.  The final proximal anastomosis was then tied  and the crossclamp was removed.   The heart resumed a spontaneous rhythm.  The air was aspirated from the  coronaries with a 27-gauge needle.  The coronaries were perfused and each  graft had good flow.  The cardioplegia catheters were removed.  Hemostasis  was documented at the proximal and distal coronary anastomoses.  Temporary  pacing wires were applied.  The patient was rewarmed to 37 degrees.  The  lungs were then re-expanded and the ventilator was resumed.  The patient was  weaned from bypass without difficulty without requiring inotropic support.  Blood pressure and cardiac output were stable.  Protamine was administered  without adverse reaction.  The cannulas were removed.  The mediastinum was  irrigated with warm antibiotic irrigation. The leg incisions were irrigated  and closed in a standard fashion.  The superior pericardial fat was closed  over the aorta and vein grafts.  Two mediastinal and a left pleural chest  tube were placed and brought out through separate incisions.  The sternum  was closed with interrupted steel wire.  The pectoralis fascia was closed  with a running #1 Vicryl.  The subcutaneous and skin layers were closed with  a running Vicryl and sterile dressings were applied.  Total bypass time was  125 minutes with crossclamp time of 80 minutes.      Kerin Perna, M.D.  Electronically Signed    PV/MEDQ  D:  12/17/2005  T:  12/18/2005  Job:  [id number]   cc:   CVTS Office  Rollene Rotunda, MD, [facility]

## 2010-07-28 NOTE — Discharge Summary (Signed)
NAMEEMMA, [name] NO.:  [id number]   MEDICAL RECORD NO.:  [id number]          PATIENT TYPE:  INP   LOCATION:  3703                         FACILITY:  [facility]   PHYSICIAN:  Gallatin Bing, M.D. LHCDATE OF BIRTH:  1960-04-02   DATE OF ADMISSION:  08/15/2005  DATE OF DISCHARGE:  08/18/2005                                 DISCHARGE SUMMARY   PRIMARY CARDIOLOGIST:  Rollene Rotunda, M.D.   PRIMARY CARE PHYSICIAN/ENDOCRINOLOGIST:  Adrian Prince, M.D.   REASON FOR ADMISSION:  Unstable angina pectoris/acute coronary syndrome.   DISCHARGE DIAGNOSES:  1.  Multi-vessel coronary disease - medical therapy planned at this time.  2.  Ejection fraction of 55% with mild inferior akinesis at catheterization.  3.  History of type 1 diabetes mellitus complicated by retinopathy and      neuropathy.  4.  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  5.  Treated hypertension.  6.  Treated dyslipidemia.  7.  Treated hypothyroidism.   PROCEDURE:  Performed this admission, cardiac catheterization by Dr. Rollene Rotunda on August 17, 2005.   HISTORY OF PRESENT ILLNESS:  [name] is a 50 year old male patient with  history of type 1 diabetes mellitus, hypertension, hyperlipidemia, who  developed chest discomfort on the date of admission. He was transported by  EMS to come to the emergency room. His EKG showed sinus tachycardia with a  heart rate of 102, incomplete right bundle branch block, and subtle non-  specific infra-lateral ST changes. His initial markers were mildly abnormal  with an MB of 4.6 and a troponin I of 0.09. The patient was admitted for  further evaluation and treatment.   HOSPITAL COURSE:  As noted above, the patient was admitted for further  evaluation and treatment of chest discomfort and acute coronary syndrome.  There was some concern over whether or not to use anticoagulation and  antiplatelet therapy. The patient had  been 20 plus years since his retinal  hemorrhage. Our service did try to contact [facility] but could  not reach anyone for assistance. It was felt by our staff that it should be  safe to at least start him on aspirin. This was started. He was also started  on beta blocker. His angiotensin receptor blocker was held. The patient was  set up for cardiac catheterization to further evaluate. At catheterization,  the patient was noted to have multi-vessel disease. Specifically, his LAD  was calcified with moderate long 50% proximal/mid stenosis. The first  diagonal had moderate 40% proximal stenosis and the second diagonal was  small with long 90% stenosis. The mid circumflex had 50% stenosis and  diffuse 25% to 30% stenosis and the distal had 60% to 70% stenosis after the  obtuse marginal. Ramus intermedius had a proximal mid 80% stenosis. The  first and second obtuse marginal's were tiny. The third obtuse marginal was  small and had an ostial 70% lesion and the fourth obtuse marginal was large  and had proximal 70% lesion. The RCA was dominant with a  proximal 70%  lesion, 90% before the PDA, and 100% after the PDA. His ventriculogram  reveals an EF of 55% with mild inferior akinesis and distal anterior  akinesis. It was felt that the patient should be treated medically at this  point in time because he does not have high grade LAD disease. It was felt  that percutaneous re-vascularization for either the ramus or the RCA would  not be reasonable for any long term success. Aggressive risk factor  modification was recommended.   The patient was followed by Dr. Evlyn Kanner during the admission. He assisted with  glucose control for the patient's diabetes. He placed the patient on daily  Lantus. His other diabetic medications were continued the same.   Dr. Dietrich Pates saw the patient on the morning of August 18, 2005. He felt the  patient was stable enough for discharge to home. He recommended  aspirin 81  mg a day and increasing his beta blocker to 50 mg a day. Prescription for  sublingual nitroglycerin and increasing his Statin. He had been on 20 of  Zocor in the hospital, which was equivalent of 40 of Pravachol. Therefore,  we sent him home on 52 of Pravachol and he will get a followup lipid panel  in about 4 weeks.   LABORATORY DATA:  White count 5,900. Hemoglobin and hematocrit 13 and 38.  Platelet count 213,000. INR on admission 1.0. Sodium 139, potassium 4.3,  glucose 189, BUN 14, creatinine 1.0, calcium 8.9. Liver function studies  okay. Total protein 5.9, albumin 3.7, hemoglobin A1C 7.7. Cardiac enzymes as  noted above. Total cholesterol 131, triglycerides 25, HDL 59, LDL 67, free  T4 1.22, TSH 0.624.   Chest x-ray on admission:  Cardiomegaly and vascular congestion.   DISCHARGE MEDICATIONS:  1.  Coated aspirin 81 mg daily.  2.  Metoprolol 50 mg b.i.d.  3.  Pravachol 80 mg q.h.s.  4.  Nitroglycerin p.r.n. chest pain.  5.  Benazepril 20 mg daily.  6.  Neurontin as taken previously.  7.  Synthroid as taken previously (we think he is on 100 mcg daily.)  8.  Insulin NPH 22 units in the morning and 20 units in the evening.  9.  Sliding scale insulin.  10. Lantus insulin 38 units at bedtime.  11. The patient should confer with Dr. Evlyn Kanner about resuming his Benicar. At      this time, that is being held.   DIET:  He is to maintain a low-fat, low-sodium, diabetic diet.   ACTIVITY:  No driving, heavy lifting, exertion, or sexual activity for 3  days. He is to increase his activity slowly.   WOUND CARE:  He may shower and bathe. He is to call our office for any groin  swelling, bleeding, bruising, or fever.   FOLLOWUP:  1.  With Dr. Antoine Poche on August 30, 2005 at 4:00 p.m.  2.  He will be set up with a fasting lipid panel and LFT's around that time. 3.  He should see Dr. Evlyn Kanner in the next 1 to 2 weeks and he should call for      an appointment.   DURATION OF  DISCHARGE ENCOUNTER:  Total physician and PA time is greater  than 30 minutes on this discharge.      Tereso Newcomer, P.A.      Oatman Bing, M.D. [facility]  Electronically Signed    SW/MEDQ  D:  08/18/2005  T:  08/18/2005  Job:  [id number]   cc:  Rollene Rotunda, M.D.  [address]  [address]  [place]  [place] [address]   Tera Mater. Evlyn Kanner, M.D.  Fax: [phone]

## 2010-07-28 NOTE — Discharge Summary (Signed)
[name], [name] NO.:  [id number]   MEDICAL RECORD NO.:  [id number]          PATIENT TYPE:  INP   LOCATION:  2028                         FACILITY:  [facility]   PHYSICIAN:  [name], M.D.  DATE OF BIRTH:  1960-09-28   DATE OF ADMISSION:  12/12/2005  DATE OF DISCHARGE:                                 DISCHARGE SUMMARY   ADMISSION DIAGNOSIS:  Chest pain.   PAST MEDICAL HISTORY AND DISCHARGE DIAGNOSES:  1. Insulin-dependent diabetes mellitus.  2. Hypertension.  3. Hypothyroidism.  4. Hyperlipidemia.  5. Retinopathy.  6. Neuropathy.  7. 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  8. Shoulder adhesive capsulitis.  9. Right carpal tunnel syndrome.  10.Coronary artery disease status post coronary artery bypass grafting x4.   ALLERGIES:  PENICILLIN:  Causes rash.   BRIEF HISTORY:  The patient is a 50 year old Caucasian male with history of  insulin-dependent diabetes mellitus and hypertension who presented in the  past with anginal symptoms, after which a cardiac catheterization  demonstrated severe 3-vessel disease with nonobstructive disease in the LAD.  The patient was treated medically at that time.  The patient then presented  to the emergency room on December 12, 2005 with complaints of chest pain that  lasted several hours, located in the anterior chest with radiation to the  shoulder.  While in the emergency room, he was found to have positive  cardiac enzymes and was admitted.   HOSPITAL COURSE:  The patient was admitted on December 12, 2005 after being  diagnosed with an acute subendocardial myocardial infarction with positive  cardiac enzymes.  He was placed on IV heparin and then taken for a 2-D echo.  This revealed anterior apical hypokinesia.  The patient then underwent  cardiac catheterization on December 13, 2005, which revealed severe 3-vessel  coronary artery disease and a 70% to 80% stenosis of the LAD.  Ejection  fraction  was 45%.  Secondary to these findings, Dr. Donata [name] of the CVTS  services was consulted regarding surgical revascularization.  Dr. Donata [name]  evaluated the patient on December 13, 2005 and it was his opinion that the  patient should proceed with coronary artery bypass grafting.   The patient was maintained on routine hospital care and remained chest pain  free until such time as the surgery could be scheduled.  The patient was  taken to the OR on December 17, 2005 for coronary artery bypass grafting x4.  Endoscopic vessel harvesting was performed in the right lower extremity.  The left internal mammary artery was grafted to the LAD, saphenous vein was  grafted to the PD, saphenous vein was grafted to the OM, and saphenous vein  was grafted to the diagonal.  The patient tolerated the procedure well and  was hemodynamically stable immediately postoperatively.  The patient was  transferred from the OR to the SICU in stable condition.  The patient was  extubated without complication and woke up from anesthesia neurologically  intact.   The patient's postoperative course has progressed as expected.  On  postoperative day #1, he was  afebrile with stable vital signs and  maintaining a normal sinus rhythm.  All invasive lines and chest tubes were  discontinued in a routine manner and all drips were weaned accordingly.  The  patient has been volume overloaded postoperatively and has been diuresed  accordingly.   His diabetes mellitus has been well maintained throughout the postoperative  course and has been followed by Dr. Evlyn [name] throughout the postoperative  course.  The patient was transferred from the SICU to unit 2000 on  postoperative day #2, again without complication.  The patient's only  difficulty throughout the postoperative course, initially, was with cardiac  rehab.  He began this on postoperative day #2 and, although being very  easily tired and scared at first, has increased his  tolerance to a  satisfactory level at this time.   On postoperative day #4, the patient was without complaint, his bowel  function has returned, and he is ambulating well.  He afebrile with stable  vital signs and maintaining a normal sinus rhythm.   PHYSICAL EXAMINATION:  CARDIAC:  Regular rate and rhythm.  LUNGS:  Clear to auscultation.  ABDOMEN:  Benign.  The incisions are clean, dry, intact.  EXTREMITIES:  There is edema still present in bilateral lower extremities,  right greater than left.   The patient's diabetes mellitus is well controlled and will continue to be  followed by Dr. Evlyn [name] as an outpatient.   As long as he continues to progress in the current manner, should be ready  for discharge in the a.m. of December 22, 2005.   LABORATORY DATA:  CBC and BMP, on December 19, 2005, white count 8.8,  hemoglobin 7.4, hematocrit 29.9, platelets 155,000.  Sodium 138, potassium  4.1, BUN 11, creatinine 0.9, glucose 153.   CONDITION ON DISCHARGE:  Improved.   DISCHARGE MEDICATIONS:  1. Lopressor 50 mg b.i.d.  2. Lotensin 10 mg daily.  3. Lipitor 80 mg daily.  4. Synthroid 100 mcg daily.  5. Neurontin 600 mg t.i.d.  6. Lexapro 20 mg q.h.s.  7. Plavix 75 mg daily.  8. Lasix 40 mg daily x7 days.  9. K-Dur 20 mEq daily x7 days.  10.Lantus insulin 38 units daily.  11.Ultram 50 mg 1 to 2 q.4 to 6 hours p.r.n. pain   DISCHARGE INSTRUCTIONS:  The patient received specific written instructions  regarding activity, diet, and wound care.   FOLLOWUP:  1. Dr. Excell [name] 2 weeks after discharge.  [facility] will be      responsible for establishing this appoint for the patient, at which      time a chest x-ray will be taken.  2. Dr. Donata [name] on January 11, 2006 at 11:15 a.m.  3. Dr. Evlyn [name].  The patient will be instructed to call his office for an      appointment to follow up his diabetes mellitus.      [name], [place]     [name], M.D.  Electronically  Signed    AY/MEDQ  D:  12/21/2005  T:  12/22/2005  Job:  [id number]   cc:   [name], M.D.  [name]

## 2010-07-28 NOTE — H&P (Signed)
NAME:  [name], [name] NO.:  [id number]   MEDICAL RECORD NO.:  [id number]          PATIENT TYPE:  INP   LOCATION:  2013                         FACILITY:  [facility]   PHYSICIAN:  Reginia Forts, MD     DATE OF BIRTH:  [birth year]   DATE OF ADMISSION:  12/12/2005  DATE OF DISCHARGE:                                HISTORY & PHYSICAL   PRIMARY CARE PHYSICIAN:  Tera Mater. Evlyn Kanner, M.D.   CHIEF COMPLAINT:  Chest pain.   [name] is a 50 year old Caucasian male with a history of type 1 diabetes,  obstructive multivessel coronary artery disease, who presents with chest  pain that began approximately 12 hours ago.  Patient was recently  hospitalized in June, 2007 for similar chest pain.  Cardiac cath at that  time demonstrated multivessel disease, as noted below.  The patient was  treated medically and has remained stable up until 1:30 p.m. on December 12, 2005.  At that time, he developed sharp substernal pain radiating to the  right chest, associated with shortness of breath.  Symptoms waxed and waned  over the next 3-4 hours until patient developed nausea and nonbloody emesis  with headache.  He felt dehydrated and anorexic throughout the rest of the  evening and subsequently presented to the emergency room once the chest pain  recurred.  In the emergency room, sublingual nitroglycerin helped relieve  the chest pain.  The patient required Phenergan for symptomatic relief of  his nausea.  He denied any orthopnea, paroxysmal nocturnal edema, or  syncope.  He states that he has been compliant with his insulin regimen.   PAST MEDICAL HISTORY:  1. Coronary artery disease with multivessel lesions.      a.     Cath in June, 2007 demonstrated a normal left main, 50%       calcified lesion in the LAD, 40% proximal lesion in the first diagonal       branch.  The second diagonal branch was small with a 90% lesion.       Circumflex had a mid 50% lesion, diffuse 20-30% mid body  lesions, and       a distal 60-70% lesion.  The ramus had a proximal and mid 80% lesion.       The third obtuse marginal had an ostial 70% lesion while the fourth       obtuse marginal had a proximal 70% lesion.  The RCA had a 70% proximal       lesion and a 90% lesion before the PDA and 100% occlusion after the       PDA.  The PDA had a mid 90% lesion.  EF was 55% with an inferior       akinesis and distal anterior akinesis.  2. Hypertension.  3. Hyperlipidemia.  4. Diabetes type 1.  5. Retinopathy.  6. Neuropathy.  7. Hypothyroidism.  8. History of retinal hemorrhage with left eye blindness.  9. Shoulder adhesive capsulitis.  10.Right carpal tunnel syndrome.   ALLERGIES:  PENICILLIN.   MEDICATIONS:  1. Aspirin 81 mg daily.  2. Metoprolol 50 mg b.i.d.  3. Pravachol 80 mg once at night.  4. Nitroglycerin sublingual p.r.n.  5. Benazepril 20 mg once daily.  6. Neurontin 600 mg in the morning, 1200 mg at 2 p.m., and 600 mg at      bedtime.  7. Synthroid 100 mcg daily.  Patient is not aware of the actual full dose.  8. Insulin NPH 22 units in the a.m., 20 units in the evening.  9. Lantus 38 units at bedtime and sliding-scale insulin.   SOCIAL HISTORY:  Patient lives in [place].  He is a school custodian.  He  denies any history of tobacco or alcohol use.   FAMILY HISTORY:  Notable for no significant coronary disease in any first  degree relative.   REVIEW OF SYSTEMS:  Notable for left eye blindness and right shoulder pain.  The rest of the 12 review of systems was reviewed and is negative .   PHYSICAL EXAMINATION:  VITAL SIGNS:  Patient is afebrile.  Pulse is 52,  blood pressure 105/76.  GENERAL:  Patient is awake, alert and oriented x3 in no acute distress but  fatigue-appearing.  HEENT:  Normocephalic and atraumatic with right and left eye extraocular  movements intact, and the right pupil nonreactive to light.  NECK:  No JVD.  No carotid bruits.  LUNGS:  Clear to  auscultation bilaterally.  CARDIOVASCULAR:  Regular rhythm.  Normal rate.  No murmurs, rubs or gallops.  ABDOMEN:  Positive bowel sounds.  Soft, nontender, nondistended.  EXTREMITIES:  No clubbing, cyanosis or edema.  Distal pulses 2+.  NEURO:  Cranial nerves II-XII grossly intact.  No focal musculoskeletal or  sensory deficits.  PSYCH:  Normal affect.   Chest x-ray demonstrated no acute cardiopulmonary process.   EKG demonstrates a heart rate of 75 with a normal sinus rhythm.   Glucose is 370.  Urine demonstrates greater than 1000 glucose.  Ketones are  greater than 80.  BUN 13, creatinine 1.  White count 7.9, hemoglobin 15.  Platelet count is 265,000.  Troponin is less than 0.05.  CK is 56.  MB is  1.5.   ASSESSMENT/PLAN:  This is a 50 year old Caucasian male with type 1 diabetes  who presents with symptoms concerning for acute coronary syndrome in the  setting of mild diabetic ketoacidosis.  1. Acute coronary syndrome:  At this time, we will rule out for a      myocardial infarction.  We cannot anticoagulate secondary to history of      retinal hemorrhage.  We will continue aspirin and place this patient on      telemetry.  2. Diabetic ketoacidosis:  We will initiate insulin drip with IV      resuscitation.  3. Hypothyroidism:  Continue Synthroid.  4. Deep venous thrombosis prophylaxis:  Patient will ambulate and has been      advised to continue to do so.  If he remains in bed, then sequential      TED's will be initiated.     Reginia Forts, MD  Electronically Signed    RA/MEDQ  D:  12/13/2005  T:  12/13/2005  Job:  [id number]

## 2010-07-28 NOTE — Op Note (Signed)
NAME:  [name], [name] NO.:  [id number]   MEDICAL RECORD NO.:  [id number]          PATIENT TYPE:  INP   LOCATION:  3703                         FACILITY:  [facility]   PHYSICIAN:  Rollene Rotunda, M.D.   DATE OF BIRTH:  [birth year]   DATE OF PROCEDURE:  08/17/2005  DATE OF DISCHARGE:                                 OPERATIVE REPORT   PROCEDURE:  Left heart catheterization/coronary arteriography.   INDICATIONS:  A patient with unstable angina and longstanding diabetes.   PROCEDURE NOTE:  Left heart catheterization is performed via the right  femoral artery.  The artery is cannulated using anterior wall puncture.  A  #6 French arterial sheath is inserted via the modified Seldinger technique.  A preformed Judkins and a pigtail catheter were utilized.  The patient  tolerated the procedure well and left the lab in stable condition.  He was  slightly hypoglycemic at the beginning, and I gave him 0.5 amp of D50.   RESULTS:   HEMODYNAMICS:  1.  LV 134/18, AO 137/97.  2.  Coronaries:  The left main was normal.  The LAD was mildly calcified.      There was a moderate long 50% stenosis in the proximal mid segment.      There were diffuse 30-40% lesions in the mid and apical vessel.  The      vessel was long, wrapping the apex.  There was a first diagonal, which      was moderate sized in length but narrow in caliber with long 40%      proximal stenosis.  The second diagonal was small with long 90%      stenosis.  The circumflex in the AV groove had a mid 50-60% stenosis and      diffuse 25-30% lesions.  There was a distal 60-70% stenosis after the      fourth obtuse marginal.  The ramus intermedius was long and large.      There was a long, diffuse proximal to mid 80% stenosis.  OM 1 and OM 2      were tiny vessels.  OM 3 was small with ostial 70% stenosis.  OM 4 was      large with proximal 70% stenosis.  There was some collateral flow seen      from the circ and LAD to the  distal right coronary artery.  The right      coronary artery was a dominant vessel.  It had a proximal 70% stenosis.      There was 95% stenosis before the PDA, and the vessel was occluded after      the PDA.  The PDA itself had severe diffuse disease with a mid 90%      stenosis.  The distal right coronary was very narrow caliber, diffusely      diseased vessel.   LEFT VENTRICULOGRAM:  The left ventriculogram was obtained in the RAO  projection.  The EF was 55% with mild inferior akinesis.  There was distal  anterior akinesis in the distribution of the ramus intermedius.   CONCLUSION:  Diffuse three vessel coronary artery disease with high grade  disease, particularly in a large ramus and PDA, right coronary.  At this  point, we could consider managing the patient medically, as he does not yet  have high grade LAD disease.  I do not think percutaneous revascularization  for either the ramus or the right coronary is reasonable for any long term  success.  He would need continued aggressive risk reduction.  We can  consider nitrates  and beta blocker.  We could consider a different statin, perhaps Lipitor,  and I will discuss this with Dr. Evlyn Kanner.  I think he would need to be  followed, not only with symptoms but with follow-up cardiac catheterization  in no less than a year if we choose this route.           ______________________________  Rollene Rotunda, M.D.     JH/MEDQ  D:  08/17/2005  T:  08/17/2005  Job:  [id number]   cc:   Jeannett Senior A. Evlyn Kanner, M.D.  Fax: [phone]

## 2010-07-28 NOTE — H&P (Signed)
NAME:  NICKALOUS, STINGLEY NO.:  [id number]   MEDICAL RECORD NO.:  [id number]          PATIENT TYPE:  INP   LOCATION:  3703                         FACILITY:  [facility]   PHYSICIAN:  Jesse Sans. Wall, M.D.   DATE OF BIRTH:  1960/03/21   DATE OF ADMISSION:  08/15/2005  DATE OF DISCHARGE:                                HISTORY & PHYSICAL   PRIMARY CARE Celester Lech:  Tera Mater. Evlyn Kanner, M.D.   CHIEF COMPLAINT:  Chest pain.   HISTORY OF THE PRESENT ILLNESS:  Mr. Makki is a very pleasant 50 year old  male with type 1 diabetes mellitus, hypertension and hyperlipidemia who  developed chest pain earlier this evening while at work.  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  He was sitting and resting when he developed chest pain across  his entire chest, which gradually increased in severity to a peak of 8/10.  The chest pain came on at rest and not while he was actually exerting  himself.  He denies any previous history of exertional chest pain.  He does  report occasional episodes of brief chest pain in the past when his blood  sugar has been out of control.  He checked his glucose at the time and it  was approximately 180.  He reports some associated nausea with the chest  pain, but denies any associated shortness of breath or diaphoresis.  After  the pain persisted for a couple hours he called the EMS.  His chest pain was  relieved by one sublingual nitroglycerin and he has remained pain-free since  that time.   PAST MEDICAL HISTORY:  1.  Type 1 diabetes mellitus; onset at age 38, complicated by retinopathy and      neuropathy.  2.  Hypertension.  3.  Hyperlipidemia; the patient reports this is well controlled on Statin      therapy.  4.  Hypothyroidism.  5.  Left eye blindness for 20 years secondary to retinal hemorrhage.  6.  The patient has also had laser therapy for diabetic retinopathy of the      right eye.  7.   Shoulder adhesive capsulitis.   MEDICATIONS:  1.  Benicar 20 mg p.o. once a day.  2.  Benazepril 20 mg p.o. once a day.  3.  Pravachol 40 mg p.o. once a day.  4.  Synthroid 100 mcg p.o. once a day; patient is not completely certain if      this dose.  5.  Neurontin 600 mg in the morning, 1200 mg in the afternoon and 600 mg at      bedtime.  6.  NPH insulin 22 units in the morning and 20 units in the evening.  7.  Humalog sliding scale insulin.   ALLERGIES:  PENICILLIN.   SOCIAL HISTORY:  The patient denies smoking or alcohol intake.  He works as  a Data processing manager.   FAMILY HISTORY:  The family history is negative for any known coronary  artery disease in first degree relatives.  REVIEW OF SYSTEMS:  MUSCULOSKELETAL:  The review of systems is positive for  right shoulder pain for which he received a cortisone shot earlier today.  HEENT:  Positive for left eye blindness as detailed above.  Otherwise 10  systems are reviewed and are negative, other than as noted in the history of  present illness.   PHYSICAL EXAMINATION:  VITAL SIGNS: Blood pressure 138/80, pulse 94,  respirations 16, and oxygen saturation 100% on room air.  GENERAL APPEARANCE:  In general  the patient is breathing comfortably and is  chest pain-free.  HEENT:  Normocephalic and atraumatic.  Sclerae anicteric.  Oropharynx clear.  NECK:  The neck is supple.  No masses appreciated.  No JVD or carotid  bruits.  HEART:  Cardiovascular - regular rate and rhythm.  No murmurs, rubs or  gallops.  CHEST:  The chest is clear to auscultation bilaterally.  ABDOMEN:  The abdomen is soft, nontender and nondistended with normal bowel  sounds.  EXTREMITIES:  The extremities are warm with no edema.  VASCULAR:  Two plus radial and dorsalis pedis pulses bilaterally.  MUSCULOSKELETAL:  No joint effusions or erythema.  NEUROLOGIC EXAMINATION:  Cranial nerves are grossly intact.  She is moving  all extremities well.  PSYCHIATRIC:   The patient is alert and oriented times three.  Affect is  appropriate.   LABORATORY DATA:  EKG shows sinus tachycardia at 102 beats/minute,  incomplete right bundle branch block, subtle nonspecific inferolateral ST  depression.   Troponin less than 0.05.  CK/MB 1.9.  White blood cell count 8.6, hemoglobin  13.6, hematocrit 40 and platelets 232,000.  Sodium 133, potassium 4.2,  chloride 101, bicarb 25, BUN 16, creatinine 0.9, and glucose 273.  INR 1.0.   ASSESSMENT AND PLAN:  This is a 50 year old male with several cardiac risk  factors including severe type 1 diabetes who presents with chest pain that  has both typical and atypical features for ischemia, but does raise some  potential concern for possible unstable angina.  Initial cardiac enzymes  were negative and electrocardiogram shows only subtle nonspecific ST  changes.  Of note, his management is complicated by his history of diabetic  retinopathy including a severe hemorrhage 20 years ago that resulted in left  eye blindness for which she has been told not to take blood thinning  medications.   1.  Given the long interval since his last retinal hemorrhage over 20 years      ago, the risk/benefit ratio at this point appears to favor the use of      aspirin; however, we will not heparinize him at this point given that      his initial enzymes are negative, and prior to any such anticoagulation      we would need to discuss the case further with ophthalmology.  He will      likely need an ophthalmology consult in the morning to assess his risks      of anticoagulation should an invasive strategy be considered.   1.  We will admit him to telemetry and rule out myocardial infarction.  If      he rules out he may be a candidate for a noninvasive stress test.   1.  Give his tachycardia, hypertension and presentation with chest pain we     will start him on metoprolol at 25 mg every 12 hours and assess his      response.  We will  continue his ACE inhibitor therapy  with benazepril.      For now we will stop his Benicar (he has been on dual ACE inhibitor and      ARB therapy).   1.  We will continue his Statin and check a fasting lipid profile in the      morning.   1.  We will continue his home dose of Synthroid and his family has been      instructed to obtain his medication bottles to check to make sure we      have the proper doses.   1.  We will continue insulin for his diabetes, reducing the dose while he is      NPO for potential studies.   1.  No deep venous thrombosis prophylaxis is necessary at present as the      patient will be ambulatory once he rules out for myocardial infarction.      If he remains hospitalized this will need to be readdressed.      Lowella Bandy, MD   Electronically Signed     ______________________________  Jesse Sans. Wall, M.D.    JJC/MEDQ  D:  08/16/2005  T:  08/16/2005  Job:  [id number]

## 2011-03-26 DIAGNOSIS — I2510 Atherosclerotic heart disease of native coronary artery without angina pectoris: Secondary | ICD-10-CM | POA: Diagnosis not present

## 2011-03-26 DIAGNOSIS — E11359 Type 2 diabetes mellitus with proliferative diabetic retinopathy without macular edema: Secondary | ICD-10-CM | POA: Diagnosis not present

## 2011-03-26 DIAGNOSIS — E1039 Type 1 diabetes mellitus with other diabetic ophthalmic complication: Secondary | ICD-10-CM | POA: Diagnosis not present

## 2011-03-26 DIAGNOSIS — F329 Major depressive disorder, single episode, unspecified: Secondary | ICD-10-CM | POA: Diagnosis not present

## 2011-09-25 DIAGNOSIS — E1039 Type 1 diabetes mellitus with other diabetic ophthalmic complication: Secondary | ICD-10-CM | POA: Diagnosis not present

## 2011-09-25 DIAGNOSIS — F329 Major depressive disorder, single episode, unspecified: Secondary | ICD-10-CM | POA: Diagnosis not present

## 2011-09-25 DIAGNOSIS — E785 Hyperlipidemia, unspecified: Secondary | ICD-10-CM | POA: Diagnosis not present

## 2012-01-11 DIAGNOSIS — Z23 Encounter for immunization: Secondary | ICD-10-CM | POA: Diagnosis not present

## 2012-01-30 DIAGNOSIS — E1029 Type 1 diabetes mellitus with other diabetic kidney complication: Secondary | ICD-10-CM | POA: Diagnosis not present

## 2012-01-30 DIAGNOSIS — E039 Hypothyroidism, unspecified: Secondary | ICD-10-CM | POA: Diagnosis not present

## 2012-01-30 DIAGNOSIS — R5383 Other fatigue: Secondary | ICD-10-CM | POA: Diagnosis not present

## 2012-01-30 DIAGNOSIS — E1039 Type 1 diabetes mellitus with other diabetic ophthalmic complication: Secondary | ICD-10-CM | POA: Diagnosis not present

## 2012-01-30 DIAGNOSIS — F329 Major depressive disorder, single episode, unspecified: Secondary | ICD-10-CM | POA: Diagnosis not present

## 2012-01-30 DIAGNOSIS — R5381 Other malaise: Secondary | ICD-10-CM | POA: Diagnosis not present

## 2012-02-11 ENCOUNTER — Encounter: Payer: Self-pay | Admitting: Cardiology

## 2012-02-18 ENCOUNTER — Ambulatory Visit (INDEPENDENT_AMBULATORY_CARE_PROVIDER_SITE_OTHER): Payer: Medicare Other | Admitting: Cardiology

## 2012-02-18 ENCOUNTER — Encounter: Payer: Self-pay | Admitting: Cardiology

## 2012-02-18 VITALS — BP 145/90 | HR 77 | Ht 71.0 in | Wt 253.1 lb

## 2012-02-18 DIAGNOSIS — E785 Hyperlipidemia, unspecified: Secondary | ICD-10-CM

## 2012-02-18 DIAGNOSIS — I2510 Atherosclerotic heart disease of native coronary artery without angina pectoris: Secondary | ICD-10-CM | POA: Diagnosis not present

## 2012-02-18 DIAGNOSIS — I10 Essential (primary) hypertension: Secondary | ICD-10-CM | POA: Diagnosis not present

## 2012-02-18 DIAGNOSIS — E119 Type 2 diabetes mellitus without complications: Secondary | ICD-10-CM

## 2012-02-18 DIAGNOSIS — E663 Overweight: Secondary | ICD-10-CM

## 2012-02-18 NOTE — Patient Instructions (Addendum)
The current medical regimen is effective;  continue present plan and medications.  Your physician has requested that you have an exercise tolerance test. For further information please visit [email or web address]. Please also follow instruction sheet, as given.  Follow up in 1 year with Dr Hochrein.  You will receive a letter in the mail 2 months before you are due.  Please call us when you receive this letter to schedule your follow up appointment.  

## 2012-02-18 NOTE — Progress Notes (Signed)
HPI The patient returns for followup of his known coronary disease. Since he was last seen he's had no new cardiovascular complaints. The patient denies any new symptoms such as chest discomfort, neck or arm discomfort. There has been no new shortness of breath, PND or orthopnea. There have been no reported palpitations, presyncope or syncope.  Unfortunately he is not exercising as much as I would like. He has gained almost 40 pounds since 2010.    Allergies  Allergen Reactions  . Ciprofloxacin     REACTION: hives  . Penicillins     Current Outpatient Prescriptions  Medication Sig Dispense Refill  . aspirin 81 MG tablet Take 81 mg by mouth daily.      Marland Kitchen atorvastatin (LIPITOR) 80 MG tablet Take 80 mg by mouth daily.      . B-D UF III MINI PEN NEEDLES 31G X 5 MM MISC       . BENAZEPRIL HCL PO Take 10 mg by mouth daily.      Marland Kitchen buPROPion (WELLBUTRIN XL) 300 MG 24 hr tablet Take 300 mg by mouth daily.      Marland Kitchen escitalopram (LEXAPRO) 10 MG tablet       . gabapentin (NEURONTIN) 600 MG tablet Take 600 mg by mouth 4 (four) times daily.      Marland Kitchen HUMALOG KWIKPEN 100 UNIT/ML injection       . insulin aspart (NOVOLOG) 100 UNIT/ML injection Inject into the skin as directed.      . insulin glargine (LANTUS) 100 UNIT/ML injection Inject 100 Units into the skin as directed.      Marland Kitchen LEVEMIR FLEXPEN 100 UNIT/ML injection       . levothyroxine (SYNTHROID, LEVOTHROID) 125 MCG tablet Take 125 mcg by mouth daily.      Marland Kitchen METOPROLOL TARTRATE PO Take 50 mg by mouth 2 (two) times daily.      . Multiple Vitamins-Minerals (CENTRUM SILVER PO) Take by mouth daily.        Past Medical History  Diagnosis Date  . CAD 07/07/2008    Qualifier: Diagnosis of  By: Cristela Felt, CNA, Christy    . DYSLIPIDEMIA 07/07/2008    Qualifier: Diagnosis of  By: Cristela Felt, CNA, Christy    . HYPERTENSION 07/07/2008    Qualifier: Diagnosis of  By: Cristela Felt, CNA, Christy    . DM 07/07/2008    Qualifier: Diagnosis of  By: Cristela Felt, CNA, Christy    .  HYPOTHYROIDISM 07/07/2008    Qualifier: Diagnosis of  By: Cristela Felt, CNA, Christy    . Overweight 07/08/2008    Qualifier: Diagnosis of  By: Antoine Poche MD, Gerrit Heck      Past Surgical History  Procedure Date  . Coronary artery bypass graft     2007 LIMA to the LAD, SVG to PDA, SVG to OM, SVG to diagonal  . Enucleation     Left    ROS:  As stated in the HPI and negative for all other systems.  PHYSICAL EXAM BP 145/90  Pulse 77  Ht 5\' 11"  (1.803 m)  Wt 253 lb 1.9 oz (114.814 kg)  BMI 35.30 kg/m2 GENERAL:  Well appearing HEENT:  Pupils equal round and reactive, fundi not visualized, oral mucosa unremarkable, left eye enucleation NECK:  No jugular venous distention, waveform within normal limits, carotid upstroke brisk and symmetric, no bruits, no thyromegaly LYMPHATICS:  No cervical, inguinal adenopathy LUNGS:  Clear to auscultation bilaterally BACK:  No CVA tenderness CHEST:  Well healed sternotomy scar. HEART:  PMI  not displaced or sustained,S1 and S2 within normal limits, no S3, no S4, no clicks, no rubs, no murmurs ABD:  Flat, positive bowel sounds normal in frequency in pitch, no bruits, no rebound, no guarding, no midline pulsatile mass, no hepatomegaly, no splenomegaly, obese EXT:  2 plus pulses throughout, mild diffuse edema, no cyanosis no clubbing SKIN:  No rashes no nodules NEURO:  Cranial nerves II through XII grossly intact, motor grossly intact throughout PSYCH:  Cognitively intact, oriented to person place and time   EKG:  Sinus rhythm, rate, 77, RAD, intervals within normal limits, no acute ST-T wave changes. Poor anterior R wave progression  02/18/2012   ASSESSMENT AND PLAN  CAD  He has had no new cardiovascular symptoms.  I will bring the patient back for a POET (Plain Old Exercise Test). This will allow me to screen for obstructive coronary disease, risk stratify and very importantly provide a prescription for exercise.  OVERWEIGHT  The patient understands the  need to lose weight with diet and exercise. We have discussed specific strategies for this.  HYPERTENSION  The blood pressure is at target. No change in medications is indicated. We will continue with therapeutic lifestyle changes (TLC).  HYPERLIPIDEMIA The patient's most recent LDL 65 HDL of 47. He will continue on the meds as listed.

## 2012-02-25 DIAGNOSIS — H35379 Puckering of macula, unspecified eye: Secondary | ICD-10-CM | POA: Diagnosis not present

## 2012-02-25 DIAGNOSIS — H02409 Unspecified ptosis of unspecified eyelid: Secondary | ICD-10-CM | POA: Diagnosis not present

## 2012-02-25 DIAGNOSIS — E1139 Type 2 diabetes mellitus with other diabetic ophthalmic complication: Secondary | ICD-10-CM | POA: Diagnosis not present

## 2012-02-25 DIAGNOSIS — E11359 Type 2 diabetes mellitus with proliferative diabetic retinopathy without macular edema: Secondary | ICD-10-CM | POA: Diagnosis not present

## 2012-02-25 DIAGNOSIS — Z961 Presence of intraocular lens: Secondary | ICD-10-CM | POA: Diagnosis not present

## 2012-03-11 ENCOUNTER — Encounter: Payer: Medicare Other | Admitting: Physician Assistant

## 2012-05-28 DIAGNOSIS — E1039 Type 1 diabetes mellitus with other diabetic ophthalmic complication: Secondary | ICD-10-CM | POA: Diagnosis not present

## 2012-05-28 DIAGNOSIS — E11359 Type 2 diabetes mellitus with proliferative diabetic retinopathy without macular edema: Secondary | ICD-10-CM | POA: Diagnosis not present

## 2012-05-28 DIAGNOSIS — E785 Hyperlipidemia, unspecified: Secondary | ICD-10-CM | POA: Diagnosis not present

## 2012-05-28 DIAGNOSIS — E039 Hypothyroidism, unspecified: Secondary | ICD-10-CM | POA: Diagnosis not present

## 2012-05-28 DIAGNOSIS — I10 Essential (primary) hypertension: Secondary | ICD-10-CM | POA: Diagnosis not present

## 2012-05-28 DIAGNOSIS — F329 Major depressive disorder, single episode, unspecified: Secondary | ICD-10-CM | POA: Diagnosis not present

## 2012-05-28 DIAGNOSIS — I2510 Atherosclerotic heart disease of native coronary artery without angina pectoris: Secondary | ICD-10-CM | POA: Diagnosis not present

## 2012-06-08 ENCOUNTER — Emergency Department (HOSPITAL_BASED_OUTPATIENT_CLINIC_OR_DEPARTMENT_OTHER)
Admission: EM | Admit: 2012-06-08 | Discharge: 2012-06-08 | Disposition: A | Discharge status: Home / Self Care | Payer: Medicare Other | Attending: Emergency Medicine | Admitting: Emergency Medicine

## 2012-06-08 ENCOUNTER — Encounter (HOSPITAL_BASED_OUTPATIENT_CLINIC_OR_DEPARTMENT_OTHER): Payer: Self-pay | Admitting: *Deleted

## 2012-06-08 DIAGNOSIS — Z7982 Long term (current) use of aspirin: Secondary | ICD-10-CM | POA: Diagnosis not present

## 2012-06-08 DIAGNOSIS — E785 Hyperlipidemia, unspecified: Secondary | ICD-10-CM | POA: Insufficient documentation

## 2012-06-08 DIAGNOSIS — I10 Essential (primary) hypertension: Secondary | ICD-10-CM | POA: Diagnosis not present

## 2012-06-08 DIAGNOSIS — E039 Hypothyroidism, unspecified: Secondary | ICD-10-CM | POA: Insufficient documentation

## 2012-06-08 DIAGNOSIS — Z79899 Other long term (current) drug therapy: Secondary | ICD-10-CM | POA: Diagnosis not present

## 2012-06-08 DIAGNOSIS — Z862 Personal history of diseases of the blood and blood-forming organs and certain disorders involving the immune mechanism: Secondary | ICD-10-CM | POA: Diagnosis not present

## 2012-06-08 DIAGNOSIS — B0089 Other herpesviral infection: Secondary | ICD-10-CM | POA: Diagnosis not present

## 2012-06-08 DIAGNOSIS — Z794 Long term (current) use of insulin: Secondary | ICD-10-CM | POA: Insufficient documentation

## 2012-06-08 DIAGNOSIS — E119 Type 2 diabetes mellitus without complications: Secondary | ICD-10-CM | POA: Insufficient documentation

## 2012-06-08 DIAGNOSIS — I2510 Atherosclerotic heart disease of native coronary artery without angina pectoris: Secondary | ICD-10-CM | POA: Diagnosis not present

## 2012-06-08 DIAGNOSIS — Z8639 Personal history of other endocrine, nutritional and metabolic disease: Secondary | ICD-10-CM | POA: Insufficient documentation

## 2012-06-08 LAB — GLUCOSE, CAPILLARY: Glucose-Capillary: 56 mg/dL — ABNORMAL LOW (ref 70–99)

## 2012-06-08 MED ORDER — ACYCLOVIR 400 MG PO TABS: 400.0000 mg | ORAL_TABLET | Freq: Three times a day (TID) | ORAL | Status: AC

## 2012-06-08 MED ORDER — CLINDAMYCIN HCL 150 MG PO CAPS: 150.0000 mg | ORAL_CAPSULE | Freq: Three times a day (TID) | ORAL | Status: DC

## 2012-06-08 NOTE — ED Notes (Addendum)
Pt states he noticed a blister on his left middle finger about a week ago. Tried to pop it and now it is more painful and red. Pt is a diabetic.

## 2012-06-08 NOTE — ED Provider Notes (Signed)
History     CSN: [id number]  Arrival date & time 06/08/12  1316   First MD Initiated Contact with Patient 06/08/12 1329      Chief Complaint  Patient presents with  . Hand Pain    Patient is a 52 y.o. male presenting with hand pain. The history is provided by the patient.  Hand Pain This is a new problem. The current episode started more than 2 days ago. The problem occurs constantly. The problem has been gradually worsening. Exacerbated by: palpation. Nothing relieves the symptoms. Treatments tried: "popping blister" The treatment provided no relief.  pt reports blister formed on left middle finger a week ago.  He "popped" it and clear fluid was expressed, but now it is worsening.  He reports it is now red and swollen.  No injury or trauma to his finger.  He has no other medical problems  Past Medical History  Diagnosis Date  . CAD 07/07/2008    Qualifier: Diagnosis of  By: Cristela Felt, CNA, Christy    . DYSLIPIDEMIA 07/07/2008    Qualifier: Diagnosis of  By: Cristela Felt, CNA, Christy    . HYPERTENSION 07/07/2008    Qualifier: Diagnosis of  By: Cristela Felt, CNA, Christy    . DM 07/07/2008    Qualifier: Diagnosis of  By: Cristela Felt, CNA, Christy    . HYPOTHYROIDISM 07/07/2008    Qualifier: Diagnosis of  By: Cristela Felt, CNA, Christy    . Overweight 07/08/2008    Qualifier: Diagnosis of  By: Antoine Poche MD, Gerrit Heck      Past Surgical History  Procedure Laterality Date  . Coronary artery bypass graft      2007 LIMA to the LAD, SVG to PDA, SVG to OM, SVG to diagonal  . Enucleation      Left    History reviewed. No pertinent family history.  History  Substance Use Topics  . Smoking status: Never Smoker   . Smokeless tobacco: Not on file  . Alcohol Use: Not on file      Review of Systems  Constitutional: Negative for fever.  Skin: Positive for wound.    Allergies  Ciprofloxacin and Penicillins  Home Medications   Current Outpatient Rx  Name  Route  Sig  Dispense  Refill  . aspirin 81 MG  tablet   Oral   Take 81 mg by mouth daily.         Marland Kitchen atorvastatin (LIPITOR) 80 MG tablet   Oral   Take 80 mg by mouth daily.         . B-D UF III MINI PEN NEEDLES 31G X 5 MM MISC               . BENAZEPRIL HCL PO   Oral   Take 10 mg by mouth daily.         Marland Kitchen buPROPion (WELLBUTRIN XL) 300 MG 24 hr tablet   Oral   Take 300 mg by mouth daily.         Marland Kitchen escitalopram (LEXAPRO) 10 MG tablet               . gabapentin (NEURONTIN) 600 MG tablet   Oral   Take 600 mg by mouth 4 (four) times daily.         Marland Kitchen HUMALOG KWIKPEN 100 UNIT/ML injection               . insulin aspart (NOVOLOG) 100 UNIT/ML injection   Subcutaneous   Inject into the skin as  directed.         . insulin glargine (LANTUS) 100 UNIT/ML injection   Subcutaneous   Inject 100 Units into the skin as directed.         Marland Kitchen LEVEMIR FLEXPEN 100 UNIT/ML injection               . levothyroxine (SYNTHROID, LEVOTHROID) 125 MCG tablet   Oral   Take 125 mcg by mouth daily.         Marland Kitchen METOPROLOL TARTRATE PO   Oral   Take 50 mg by mouth 2 (two) times daily.         . Multiple Vitamins-Minerals (CENTRUM SILVER PO)   Oral   Take by mouth daily.           BP 157/73  Pulse 80  Temp(Src) 98.3 F (36.8 C) (Oral)  Resp 20  Ht 5\' 11"  (1.803 m)  Wt 260 lb (117.935 kg)  BMI 36.28 kg/m2  SpO2 99%  Physical Exam CONSTITUTIONAL: Well developed/well nourished HEAD: Normocephalic/atraumatic ENMT: Mucous membranes moist NECK: supple no meningeal signs CV: S1/S2 noted, no murmurs/rubs/gallops noted LUNGS: Lungs are clear to auscultation bilaterally, no apparent distress ABDOMEN: soft, nontender, no rebound or guarding NEURO: Pt is awake/alert, moves all extremitiesx4 EXTREMITIES: pulses normal, full ROM Multiple lesions to left middle finger with mild tenderness.  No crepitance/drainage.  No erythematous streaking.  He can range the finger/flex/extend the finger without difficulty. 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  Does not appear to be paronychioa/felon.  He may be developing early secondary infection, so clindamycin ordered.  Advised wound recheck later this week by the ER or his PCP dr Evlyn Kanner.  His glucose was improved after eating crackers.  He is asymptomatic   MDM  Nursing notes including past medical history and social history reviewed and considered in documentation Labs/vital reviewed and considered         Joya Gaskins, MD 06/08/12 1418

## 2012-06-08 NOTE — ED Notes (Signed)
Dr Bebe Shaggy aware of pt's CBG 56- apple juice, cheese and crackers given

## 2012-06-08 NOTE — ED Notes (Signed)
rx x 2 given for clindamycin and acyclovir

## 2012-12-24 DIAGNOSIS — E1142 Type 2 diabetes mellitus with diabetic polyneuropathy: Secondary | ICD-10-CM | POA: Diagnosis not present

## 2012-12-24 DIAGNOSIS — E11359 Type 2 diabetes mellitus with proliferative diabetic retinopathy without macular edema: Secondary | ICD-10-CM | POA: Diagnosis not present

## 2012-12-24 DIAGNOSIS — E039 Hypothyroidism, unspecified: Secondary | ICD-10-CM | POA: Diagnosis not present

## 2012-12-24 DIAGNOSIS — I10 Essential (primary) hypertension: Secondary | ICD-10-CM | POA: Diagnosis not present

## 2012-12-24 DIAGNOSIS — Z23 Encounter for immunization: Secondary | ICD-10-CM | POA: Diagnosis not present

## 2012-12-24 DIAGNOSIS — E785 Hyperlipidemia, unspecified: Secondary | ICD-10-CM | POA: Diagnosis not present

## 2012-12-24 DIAGNOSIS — F329 Major depressive disorder, single episode, unspecified: Secondary | ICD-10-CM | POA: Diagnosis not present

## 2012-12-24 DIAGNOSIS — E1039 Type 1 diabetes mellitus with other diabetic ophthalmic complication: Secondary | ICD-10-CM | POA: Diagnosis not present

## 2013-04-06 DIAGNOSIS — Z970 Presence of artificial eye: Secondary | ICD-10-CM | POA: Diagnosis not present

## 2013-04-06 DIAGNOSIS — E1139 Type 2 diabetes mellitus with other diabetic ophthalmic complication: Secondary | ICD-10-CM | POA: Diagnosis not present

## 2013-04-06 DIAGNOSIS — H35379 Puckering of macula, unspecified eye: Secondary | ICD-10-CM | POA: Diagnosis not present

## 2013-04-06 DIAGNOSIS — E11359 Type 2 diabetes mellitus with proliferative diabetic retinopathy without macular edema: Secondary | ICD-10-CM | POA: Diagnosis not present

## 2013-04-06 DIAGNOSIS — H2640 Unspecified secondary cataract: Secondary | ICD-10-CM | POA: Diagnosis not present

## 2013-04-06 DIAGNOSIS — H02409 Unspecified ptosis of unspecified eyelid: Secondary | ICD-10-CM | POA: Diagnosis not present

## 2013-04-06 DIAGNOSIS — Z961 Presence of intraocular lens: Secondary | ICD-10-CM | POA: Diagnosis not present

## 2013-05-13 DIAGNOSIS — E1142 Type 2 diabetes mellitus with diabetic polyneuropathy: Secondary | ICD-10-CM | POA: Diagnosis not present

## 2013-05-13 DIAGNOSIS — F329 Major depressive disorder, single episode, unspecified: Secondary | ICD-10-CM | POA: Diagnosis not present

## 2013-05-13 DIAGNOSIS — R7401 Elevation of levels of liver transaminase levels: Secondary | ICD-10-CM | POA: Diagnosis not present

## 2013-05-13 DIAGNOSIS — E785 Hyperlipidemia, unspecified: Secondary | ICD-10-CM | POA: Diagnosis not present

## 2013-05-13 DIAGNOSIS — F3289 Other specified depressive episodes: Secondary | ICD-10-CM | POA: Diagnosis not present

## 2013-05-13 DIAGNOSIS — I10 Essential (primary) hypertension: Secondary | ICD-10-CM | POA: Diagnosis not present

## 2013-05-13 DIAGNOSIS — E039 Hypothyroidism, unspecified: Secondary | ICD-10-CM | POA: Diagnosis not present

## 2013-05-13 DIAGNOSIS — E1039 Type 1 diabetes mellitus with other diabetic ophthalmic complication: Secondary | ICD-10-CM | POA: Diagnosis not present

## 2013-05-13 DIAGNOSIS — E11359 Type 2 diabetes mellitus with proliferative diabetic retinopathy without macular edema: Secondary | ICD-10-CM | POA: Diagnosis not present

## 2013-06-18 DIAGNOSIS — E11359 Type 2 diabetes mellitus with proliferative diabetic retinopathy without macular edema: Secondary | ICD-10-CM | POA: Diagnosis not present

## 2013-06-18 DIAGNOSIS — H02409 Unspecified ptosis of unspecified eyelid: Secondary | ICD-10-CM | POA: Diagnosis not present

## 2013-06-18 DIAGNOSIS — H35379 Puckering of macula, unspecified eye: Secondary | ICD-10-CM | POA: Diagnosis not present

## 2013-06-18 DIAGNOSIS — Z970 Presence of artificial eye: Secondary | ICD-10-CM | POA: Diagnosis not present

## 2013-06-18 DIAGNOSIS — Z961 Presence of intraocular lens: Secondary | ICD-10-CM | POA: Diagnosis not present

## 2013-06-18 DIAGNOSIS — E1139 Type 2 diabetes mellitus with other diabetic ophthalmic complication: Secondary | ICD-10-CM | POA: Diagnosis not present

## 2013-06-18 DIAGNOSIS — H2640 Unspecified secondary cataract: Secondary | ICD-10-CM | POA: Diagnosis not present

## 2013-07-21 ENCOUNTER — Encounter: Payer: Self-pay | Admitting: *Deleted

## 2013-07-24 ENCOUNTER — Encounter: Payer: Self-pay | Admitting: Cardiology

## 2013-07-24 ENCOUNTER — Ambulatory Visit (INDEPENDENT_AMBULATORY_CARE_PROVIDER_SITE_OTHER): Payer: Medicare Other | Admitting: Cardiology

## 2013-07-24 VITALS — BP 131/73 | HR 69 | Ht 71.0 in | Wt 257.0 lb

## 2013-07-24 DIAGNOSIS — I10 Essential (primary) hypertension: Secondary | ICD-10-CM

## 2013-07-24 DIAGNOSIS — I2510 Atherosclerotic heart disease of native coronary artery without angina pectoris: Secondary | ICD-10-CM

## 2013-07-24 NOTE — Progress Notes (Signed)
HPI The patient returns for followup of his known coronary disease. Since he was last seen he's had no new cardiovascular complaints.  He was to have a POET (Plain Old Exercise Treadmill) after the last visit but he did not do this.   The patient denies any new symptoms such as chest discomfort, neck or arm discomfort. There has been no new shortness of breath, PND or orthopnea. There have been no reported palpitations, presyncope or syncope.  Unfortunately he is not exercising as much as I would like. He does work in the yard.   Allergies  Allergen Reactions  . Ciprofloxacin     REACTION: hives  . Penicillins     Current Outpatient Prescriptions  Medication Sig Dispense Refill  . aspirin 81 MG tablet Take 81 mg by mouth daily.      Marland Kitchen. atorvastatin (LIPITOR) 80 MG tablet Take 80 mg by mouth daily.      . B-D UF III MINI PEN NEEDLES 31G X 5 MM MISC       . BENAZEPRIL HCL PO Take 10 mg by mouth daily.      Marland Kitchen. buPROPion (WELLBUTRIN XL) 300 MG 24 hr tablet Take 300 mg by mouth 2 (two) times daily.       Marland Kitchen. escitalopram (LEXAPRO) 10 MG tablet       . gabapentin (NEURONTIN) 600 MG tablet Take 600 mg by mouth 4 (four) times daily.      Marland Kitchen. HUMALOG KWIKPEN 100 UNIT/ML injection       . insulin glargine (LANTUS) 100 UNIT/ML injection Inject into the skin as directed. 24 units  in 24 units  pm      . LEVEMIR FLEXPEN 100 UNIT/ML injection       . levothyroxine (SYNTHROID, LEVOTHROID) 125 MCG tablet Take 125 mcg by mouth daily.      Marland Kitchen. METOPROLOL TARTRATE PO Take 50 mg by mouth 2 (two) times daily.      . Multiple Vitamins-Minerals (CENTRUM SILVER PO) Take by mouth daily.       No current facility-administered medications for this visit.    Past Medical History  Diagnosis Date  . CAD 07/07/2008    Qualifier: Diagnosis of  By: Cristela Feltuck, CNA, Christy    . DYSLIPIDEMIA 07/07/2008    Qualifier: Diagnosis of  By: Cristela Feltuck, CNA, Christy    . HYPERTENSION 07/07/2008    Qualifier: Diagnosis of  By: Cristela Feltuck, CNA,  Christy    . DM 07/07/2008    Qualifier: Diagnosis of  By: Cristela Feltuck, CNA, Christy    . HYPOTHYROIDISM 07/07/2008    Qualifier: Diagnosis of  By: Cristela Feltuck, CNA, Christy    . Overweight 07/08/2008    Qualifier: Diagnosis of  By: Antoine PocheHochrein, MD, Gerrit HeckFACC, Atonya Templer      Past Surgical History  Procedure Laterality Date  . Coronary artery bypass graft      2007 LIMA to the LAD, SVG to PDA, SVG to OM, SVG to diagonal  . Enucleation      Left    ROS:  As stated in the HPI and negative for all other systems.  PHYSICAL EXAM BP 131/73  Pulse 69  Ht 5\' 11"  (1.803 m)  Wt 257 lb (116.574 kg)  BMI 35.86 kg/m2 GENERAL:  Well appearing HEENT:  Pupils equal round and reactive, fundi not visualized, oral mucosa unremarkable, left eye enucleation NECK:  No jugular venous distention, waveform within normal limits, carotid upstroke brisk and symmetric, no bruits, no thyromegaly LUNGS:  Clear to  auscultation bilaterally BACK:  No CVA tenderness CHEST:  Well healed sternotomy scar. HEART:  PMI not displaced or sustained,S1 and S2 within normal limits, no S3, no S4, no clicks, no rubs, no murmurs ABD:  Flat, positive bowel sounds normal in frequency in pitch, no bruits, no rebound, no guarding, no midline pulsatile mass, no hepatomegaly, no splenomegaly, obese EXT:  2 plus pulses throughout, mild diffuse edema, no cyanosis no clubbing SKIN:  No rashes no nodules   EKG:  Sinus rhythm, rate, 69, RAD, intervals within normal limits, no acute ST-T wave changes. Poor anterior R wave progression  07/24/2013   ASSESSMENT AND PLAN  CAD  He has had no new cardiovascular symptoms.  I will bring the patient back for a POET (Plain Old Exercise Test). This will allow me to screen for obstructive coronary disease, risk stratify and very importantly provide a prescription for exercise.  This will be on beta blocker.  OVERWEIGHT  The patient understands the need to lose weight with diet and exercise. We have discussed specific  strategies for this.  HYPERTENSION  The blood pressure is at target. No change in medications is indicated. We will continue with therapeutic lifestyle changes (TLC).  HYPERLIPIDEMIA Per Julian HySOUTH,STEPHEN ALAN, MD

## 2013-07-24 NOTE — Patient Instructions (Signed)
The current medical regimen is effective;  continue present plan and medications.  Your physician has requested that you have an exercise tolerance test. For further information please visit [email or web address]. Please also follow instruction sheet, as given.  Follow up in 1 year with Dr Hochrein.  You will receive a letter in the mail 2 months before you are due.  Please call us when you receive this letter to schedule your follow up appointment.  

## 2013-08-04 DIAGNOSIS — Z8679 Personal history of other diseases of the circulatory system: Secondary | ICD-10-CM | POA: Diagnosis not present

## 2013-08-04 DIAGNOSIS — J3489 Other specified disorders of nose and nasal sinuses: Secondary | ICD-10-CM | POA: Diagnosis not present

## 2013-08-04 DIAGNOSIS — E039 Hypothyroidism, unspecified: Secondary | ICD-10-CM | POA: Diagnosis not present

## 2013-08-04 DIAGNOSIS — IMO0001 Reserved for inherently not codable concepts without codable children: Secondary | ICD-10-CM | POA: Diagnosis not present

## 2013-08-04 DIAGNOSIS — E109 Type 1 diabetes mellitus without complications: Secondary | ICD-10-CM | POA: Diagnosis not present

## 2013-08-04 DIAGNOSIS — F3289 Other specified depressive episodes: Secondary | ICD-10-CM | POA: Diagnosis not present

## 2013-08-04 DIAGNOSIS — R0609 Other forms of dyspnea: Secondary | ICD-10-CM | POA: Diagnosis not present

## 2013-08-04 DIAGNOSIS — F329 Major depressive disorder, single episode, unspecified: Secondary | ICD-10-CM | POA: Diagnosis not present

## 2013-08-04 DIAGNOSIS — I2510 Atherosclerotic heart disease of native coronary artery without angina pectoris: Secondary | ICD-10-CM | POA: Diagnosis not present

## 2013-08-04 DIAGNOSIS — M2550 Pain in unspecified joint: Secondary | ICD-10-CM | POA: Diagnosis not present

## 2013-08-04 DIAGNOSIS — H538 Other visual disturbances: Secondary | ICD-10-CM | POA: Diagnosis not present

## 2013-08-04 DIAGNOSIS — R12 Heartburn: Secondary | ICD-10-CM | POA: Diagnosis not present

## 2013-08-04 DIAGNOSIS — I10 Essential (primary) hypertension: Secondary | ICD-10-CM | POA: Diagnosis not present

## 2013-08-04 DIAGNOSIS — Z7982 Long term (current) use of aspirin: Secondary | ICD-10-CM | POA: Diagnosis not present

## 2013-08-04 DIAGNOSIS — Z6836 Body mass index (BMI) 36.0-36.9, adult: Secondary | ICD-10-CM | POA: Diagnosis not present

## 2013-08-04 DIAGNOSIS — Z9109 Other allergy status, other than to drugs and biological substances: Secondary | ICD-10-CM | POA: Diagnosis not present

## 2013-08-04 DIAGNOSIS — E785 Hyperlipidemia, unspecified: Secondary | ICD-10-CM | POA: Diagnosis not present

## 2013-08-04 DIAGNOSIS — Z794 Long term (current) use of insulin: Secondary | ICD-10-CM | POA: Diagnosis not present

## 2013-08-17 DIAGNOSIS — I2510 Atherosclerotic heart disease of native coronary artery without angina pectoris: Secondary | ICD-10-CM | POA: Diagnosis not present

## 2013-08-17 DIAGNOSIS — H02409 Unspecified ptosis of unspecified eyelid: Secondary | ICD-10-CM | POA: Diagnosis not present

## 2013-08-17 DIAGNOSIS — E785 Hyperlipidemia, unspecified: Secondary | ICD-10-CM | POA: Diagnosis not present

## 2013-08-17 DIAGNOSIS — E1039 Type 1 diabetes mellitus with other diabetic ophthalmic complication: Secondary | ICD-10-CM | POA: Diagnosis not present

## 2013-08-17 DIAGNOSIS — E11359 Type 2 diabetes mellitus with proliferative diabetic retinopathy without macular edema: Secondary | ICD-10-CM | POA: Diagnosis not present

## 2013-08-17 DIAGNOSIS — Z7982 Long term (current) use of aspirin: Secondary | ICD-10-CM | POA: Diagnosis not present

## 2013-08-17 DIAGNOSIS — Z880 Allergy status to penicillin: Secondary | ICD-10-CM | POA: Diagnosis not present

## 2013-08-17 DIAGNOSIS — E039 Hypothyroidism, unspecified: Secondary | ICD-10-CM | POA: Diagnosis not present

## 2013-08-17 DIAGNOSIS — Z883 Allergy status to other anti-infective agents status: Secondary | ICD-10-CM | POA: Diagnosis not present

## 2013-08-17 DIAGNOSIS — H538 Other visual disturbances: Secondary | ICD-10-CM | POA: Diagnosis not present

## 2013-08-17 DIAGNOSIS — Z794 Long term (current) use of insulin: Secondary | ICD-10-CM | POA: Diagnosis not present

## 2013-08-17 DIAGNOSIS — Z9849 Cataract extraction status, unspecified eye: Secondary | ICD-10-CM | POA: Diagnosis not present

## 2013-08-17 DIAGNOSIS — Z951 Presence of aortocoronary bypass graft: Secondary | ICD-10-CM | POA: Diagnosis not present

## 2013-08-17 DIAGNOSIS — Z79899 Other long term (current) drug therapy: Secondary | ICD-10-CM | POA: Diagnosis not present

## 2013-08-17 DIAGNOSIS — I10 Essential (primary) hypertension: Secondary | ICD-10-CM | POA: Diagnosis not present

## 2013-08-24 DIAGNOSIS — H02409 Unspecified ptosis of unspecified eyelid: Secondary | ICD-10-CM | POA: Diagnosis not present

## 2013-08-24 DIAGNOSIS — I2510 Atherosclerotic heart disease of native coronary artery without angina pectoris: Secondary | ICD-10-CM | POA: Diagnosis not present

## 2013-08-24 DIAGNOSIS — Z9889 Other specified postprocedural states: Secondary | ICD-10-CM | POA: Diagnosis not present

## 2013-08-24 DIAGNOSIS — I10 Essential (primary) hypertension: Secondary | ICD-10-CM | POA: Diagnosis not present

## 2013-08-24 DIAGNOSIS — Z880 Allergy status to penicillin: Secondary | ICD-10-CM | POA: Diagnosis not present

## 2013-08-24 DIAGNOSIS — E119 Type 2 diabetes mellitus without complications: Secondary | ICD-10-CM | POA: Diagnosis not present

## 2013-08-24 DIAGNOSIS — Z951 Presence of aortocoronary bypass graft: Secondary | ICD-10-CM | POA: Diagnosis not present

## 2013-08-24 DIAGNOSIS — Z4889 Encounter for other specified surgical aftercare: Secondary | ICD-10-CM | POA: Diagnosis not present

## 2013-08-28 ENCOUNTER — Telehealth (HOSPITAL_COMMUNITY): Payer: Self-pay

## 2013-09-01 ENCOUNTER — Inpatient Hospital Stay (HOSPITAL_COMMUNITY): Admission: RE | Admit: 2013-09-01 | Payer: Medicare Other | Source: Ambulatory Visit

## 2013-09-10 NOTE — Telephone Encounter (Signed)
Encounter complete. 

## 2013-09-15 DIAGNOSIS — E11319 Type 2 diabetes mellitus with unspecified diabetic retinopathy without macular edema: Secondary | ICD-10-CM | POA: Diagnosis not present

## 2013-09-15 DIAGNOSIS — R7402 Elevation of levels of lactic acid dehydrogenase (LDH): Secondary | ICD-10-CM | POA: Diagnosis not present

## 2013-09-15 DIAGNOSIS — F329 Major depressive disorder, single episode, unspecified: Secondary | ICD-10-CM | POA: Diagnosis not present

## 2013-09-15 DIAGNOSIS — F3289 Other specified depressive episodes: Secondary | ICD-10-CM | POA: Diagnosis not present

## 2013-09-15 DIAGNOSIS — E039 Hypothyroidism, unspecified: Secondary | ICD-10-CM | POA: Diagnosis not present

## 2013-09-15 DIAGNOSIS — I10 Essential (primary) hypertension: Secondary | ICD-10-CM | POA: Diagnosis not present

## 2013-09-15 DIAGNOSIS — I2510 Atherosclerotic heart disease of native coronary artery without angina pectoris: Secondary | ICD-10-CM | POA: Diagnosis not present

## 2013-09-15 DIAGNOSIS — E785 Hyperlipidemia, unspecified: Secondary | ICD-10-CM | POA: Diagnosis not present

## 2013-09-15 DIAGNOSIS — E1039 Type 1 diabetes mellitus with other diabetic ophthalmic complication: Secondary | ICD-10-CM | POA: Diagnosis not present

## 2013-09-15 DIAGNOSIS — R7401 Elevation of levels of liver transaminase levels: Secondary | ICD-10-CM | POA: Diagnosis not present

## 2013-09-15 DIAGNOSIS — Z1331 Encounter for screening for depression: Secondary | ICD-10-CM | POA: Diagnosis not present

## 2013-10-15 ENCOUNTER — Encounter: Payer: Self-pay | Admitting: Gastroenterology

## 2013-12-18 ENCOUNTER — Encounter: Payer: Medicare Other | Admitting: Gastroenterology

## 2014-01-22 DIAGNOSIS — R5381 Other malaise: Secondary | ICD-10-CM | POA: Diagnosis not present

## 2014-01-22 DIAGNOSIS — E785 Hyperlipidemia, unspecified: Secondary | ICD-10-CM | POA: Diagnosis not present

## 2014-01-22 DIAGNOSIS — E10319 Type 1 diabetes mellitus with unspecified diabetic retinopathy without macular edema: Secondary | ICD-10-CM | POA: Diagnosis not present

## 2014-01-22 DIAGNOSIS — R740 Nonspecific elevation of levels of transaminase and lactic acid dehydrogenase [LDH]: Secondary | ICD-10-CM | POA: Diagnosis not present

## 2014-01-22 DIAGNOSIS — Z23 Encounter for immunization: Secondary | ICD-10-CM | POA: Diagnosis not present

## 2014-01-22 DIAGNOSIS — E039 Hypothyroidism, unspecified: Secondary | ICD-10-CM | POA: Diagnosis not present

## 2014-01-22 DIAGNOSIS — I10 Essential (primary) hypertension: Secondary | ICD-10-CM | POA: Diagnosis not present

## 2014-01-22 DIAGNOSIS — E11359 Type 2 diabetes mellitus with proliferative diabetic retinopathy without macular edema: Secondary | ICD-10-CM | POA: Diagnosis not present

## 2014-01-22 DIAGNOSIS — I2510 Atherosclerotic heart disease of native coronary artery without angina pectoris: Secondary | ICD-10-CM | POA: Diagnosis not present

## 2014-08-13 DIAGNOSIS — E10319 Type 1 diabetes mellitus with unspecified diabetic retinopathy without macular edema: Secondary | ICD-10-CM | POA: Diagnosis not present

## 2014-08-13 DIAGNOSIS — E11359 Type 2 diabetes mellitus with proliferative diabetic retinopathy without macular edema: Secondary | ICD-10-CM | POA: Diagnosis not present

## 2014-08-13 DIAGNOSIS — R740 Nonspecific elevation of levels of transaminase and lactic acid dehydrogenase [LDH]: Secondary | ICD-10-CM | POA: Diagnosis not present

## 2014-08-13 DIAGNOSIS — I10 Essential (primary) hypertension: Secondary | ICD-10-CM | POA: Diagnosis not present

## 2014-08-13 DIAGNOSIS — I2510 Atherosclerotic heart disease of native coronary artery without angina pectoris: Secondary | ICD-10-CM | POA: Diagnosis not present

## 2014-08-13 DIAGNOSIS — Z6835 Body mass index (BMI) 35.0-35.9, adult: Secondary | ICD-10-CM | POA: Diagnosis not present

## 2014-08-13 DIAGNOSIS — E039 Hypothyroidism, unspecified: Secondary | ICD-10-CM | POA: Diagnosis not present

## 2014-08-13 DIAGNOSIS — E1142 Type 2 diabetes mellitus with diabetic polyneuropathy: Secondary | ICD-10-CM | POA: Diagnosis not present

## 2014-08-13 DIAGNOSIS — E785 Hyperlipidemia, unspecified: Secondary | ICD-10-CM | POA: Diagnosis not present

## 2014-12-03 ENCOUNTER — Ambulatory Visit (INDEPENDENT_AMBULATORY_CARE_PROVIDER_SITE_OTHER): Payer: Medicare Other | Admitting: Cardiology

## 2014-12-03 ENCOUNTER — Encounter: Payer: Self-pay | Admitting: Cardiology

## 2014-12-03 VITALS — BP 98/70 | HR 77 | Ht 71.0 in | Wt 254.0 lb

## 2014-12-03 DIAGNOSIS — I10 Essential (primary) hypertension: Secondary | ICD-10-CM

## 2014-12-03 DIAGNOSIS — I2510 Atherosclerotic heart disease of native coronary artery without angina pectoris: Secondary | ICD-10-CM | POA: Diagnosis not present

## 2014-12-03 NOTE — Progress Notes (Signed)
HPI The patient returns for followup of his known coronary disease. Since he was last seen he's had no new cardiovascular complaints.  He was to have a POET (Plain Old Exercise Treadmill) after the last visit but he did not do this. This is the second time that he has no showed for a screening study.  He says he does such activity such as pushing a mower without symptoms. He denies any new shortness of breath, PND or orthopnea. He's had no palpitations, presyncope or syncope. He's trying to diet but hasn't lost any weight.  Allergies  Allergen Reactions  . Ciprofloxacin     REACTION: hives  . Penicillins     Current Outpatient Prescriptions  Medication Sig Dispense Refill  . aspirin 81 MG tablet Take 81 mg by mouth daily.    Marland Kitchen atorvastatin (LIPITOR) 80 MG tablet Take 80 mg by mouth daily.    . B-D UF III MINI PEN NEEDLES 31G X 5 MM MISC     . BENAZEPRIL HCL PO Take 10 mg by mouth daily.    Marland Kitchen buPROPion (WELLBUTRIN XL) 300 MG 24 hr tablet Take 300 mg by mouth 2 (two) times daily. TAKE TWO TABLETS TWICE DAILY    . gabapentin (NEURONTIN) 600 MG tablet Take 600 mg by mouth 2 (two) times daily. TAKE 2 TABLETS IN THE MORNING AND 2 TABLETS AT NIGHT    . HUMALOG KWIKPEN 100 UNIT/ML injection     . insulin glargine (LANTUS) 100 UNIT/ML injection Inject into the skin as directed. 26 UNITS IN THE MORNING AND 20 UNITS IN THE EVENING    . LEVEMIR FLEXPEN 100 UNIT/ML injection     . levothyroxine (SYNTHROID, LEVOTHROID) 125 MCG tablet Take 125 mcg by mouth daily.    Marland Kitchen METOPROLOL TARTRATE PO Take 50 mg by mouth 2 (two) times daily.    . Multiple Vitamins-Minerals (CENTRUM SILVER PO) Take by mouth daily.     No current facility-administered medications for this visit.    Past Medical History  Diagnosis Date  . CAD 07/07/2008    Qualifier: Diagnosis of  By: Cristela Felt, CNA, Christy    . DYSLIPIDEMIA 07/07/2008    Qualifier: Diagnosis of  By: Cristela Felt, CNA, Christy    . HYPERTENSION 07/07/2008    Qualifier:  Diagnosis of  By: Cristela Felt, CNA, Christy    . DM 07/07/2008    Qualifier: Diagnosis of  By: Cristela Felt, CNA, Christy    . HYPOTHYROIDISM 07/07/2008    Qualifier: Diagnosis of  By: Cristela Felt, CNA, Christy    . Overweight(278.02) 07/08/2008    Qualifier: Diagnosis of  By: Antoine Poche MD, Gerrit Heck      Past Surgical History  Procedure Laterality Date  . Coronary artery bypass graft      2007 LIMA to the LAD, SVG to PDA, SVG to OM, SVG to diagonal  . Enucleation      Left    ROS:  As stated in the HPI and negative for all other systems.  PHYSICAL EXAM BP 98/70 mmHg  Pulse 77  Ht  (1.803 m)  Wt 254 lb (115.214 kg)  BMI 35.44 kg/m2 GENERAL:  Well appearing HEENT:  Pupils equal round and reactive, fundi not visualized, oral mucosa unremarkable, left eye enucleation NECK:  No jugular venous distention, waveform within normal limits, carotid upstroke brisk and symmetric, no bruits, no thyromegaly LUNGS:  Clear to auscultation bilaterally BACK:  No CVA tenderness CHEST:  Well healed sternotomy scar. HEART:  PMI not displaced  or sustained,S1 and S2 within normal limits, no S3, no S4, no clicks, no rubs, no murmurs ABD:  Flat, positive bowel sounds normal in frequency in pitch, no bruits, no rebound, no guarding, no midline pulsatile mass, no hepatomegaly, no splenomegaly, obese EXT:  2 plus pulses upper and mildly diminished DP/PT bilateral, mild diffuse edema, no cyanosis no clubbing SKIN:  No rashes no nodules   EKG:  Sinus rhythm, rate, 77, rightward axis, intervals within normal limits, no acute ST-T wave changes. Poor anterior R wave progression. No change from previous   12/03/2014   ASSESSMENT AND PLAN  CAD  He has had no new cardiovascular symptoms. However, he has ongoing risk factors and could have blunted symptoms with his diabetes. I will bring the patient back for a POET (Plain Old Exercise Test). This will allow me to screen for obstructive coronary disease, risk stratify and very  importantly provide a prescription for exercise.  This will be on beta blocker.  OVERWEIGHT  The patient understands the need to lose weight with diet and exercise. We have discussed specific strategies for this again.  HYPERTENSION  The blood pressure is actually running low. However, he tolerates this and so I will continue the medicines as listed. If he has lightheadedness I would have to cut back on his ACE inhibitor in the future.  HYPERLIPIDEMIA/DM Per Julian Hy, MD. He says his last A1c was 6.9. He will continue the meds as listed.

## 2014-12-03 NOTE — Patient Instructions (Signed)
Your physician wants you to follow-up in: 1 Year. You will receive a reminder letter in the mail two months in advance. If you don't receive a letter, please call our office to schedule the follow-up appointment.  Your physician has requested that you have an exercise tolerance test. For further information please visit [email or web address]. Please also follow instruction sheet, as given.    

## 2014-12-15 DIAGNOSIS — E785 Hyperlipidemia, unspecified: Secondary | ICD-10-CM | POA: Diagnosis not present

## 2014-12-15 DIAGNOSIS — R740 Nonspecific elevation of levels of transaminase and lactic acid dehydrogenase [LDH]: Secondary | ICD-10-CM | POA: Diagnosis not present

## 2014-12-15 DIAGNOSIS — I10 Essential (primary) hypertension: Secondary | ICD-10-CM | POA: Diagnosis not present

## 2014-12-15 DIAGNOSIS — Z6835 Body mass index (BMI) 35.0-35.9, adult: Secondary | ICD-10-CM | POA: Diagnosis not present

## 2014-12-15 DIAGNOSIS — Z23 Encounter for immunization: Secondary | ICD-10-CM | POA: Diagnosis not present

## 2014-12-15 DIAGNOSIS — E039 Hypothyroidism, unspecified: Secondary | ICD-10-CM | POA: Diagnosis not present

## 2014-12-15 DIAGNOSIS — I2510 Atherosclerotic heart disease of native coronary artery without angina pectoris: Secondary | ICD-10-CM | POA: Diagnosis not present

## 2014-12-15 DIAGNOSIS — E1142 Type 2 diabetes mellitus with diabetic polyneuropathy: Secondary | ICD-10-CM | POA: Diagnosis not present

## 2014-12-15 DIAGNOSIS — E10319 Type 1 diabetes mellitus with unspecified diabetic retinopathy without macular edema: Secondary | ICD-10-CM | POA: Diagnosis not present

## 2014-12-15 DIAGNOSIS — F329 Major depressive disorder, single episode, unspecified: Secondary | ICD-10-CM | POA: Diagnosis not present

## 2014-12-15 DIAGNOSIS — Z125 Encounter for screening for malignant neoplasm of prostate: Secondary | ICD-10-CM | POA: Diagnosis not present

## 2014-12-17 DIAGNOSIS — H35371 Puckering of macula, right eye: Secondary | ICD-10-CM | POA: Diagnosis not present

## 2014-12-30 ENCOUNTER — Telehealth (HOSPITAL_COMMUNITY): Payer: Self-pay

## 2014-12-30 NOTE — Telephone Encounter (Signed)
Encounter complete. 

## 2014-12-31 ENCOUNTER — Telehealth (HOSPITAL_COMMUNITY): Payer: Self-pay

## 2014-12-31 NOTE — Telephone Encounter (Signed)
Encounter complete. 

## 2015-01-04 ENCOUNTER — Inpatient Hospital Stay (HOSPITAL_COMMUNITY): Admission: RE | Admit: 2015-01-04 | Payer: Medicare Other | Source: Ambulatory Visit

## 2015-01-05 DIAGNOSIS — R1030 Lower abdominal pain, unspecified: Secondary | ICD-10-CM | POA: Diagnosis not present

## 2015-01-05 DIAGNOSIS — R740 Nonspecific elevation of levels of transaminase and lactic acid dehydrogenase [LDH]: Secondary | ICD-10-CM | POA: Diagnosis not present

## 2015-01-05 DIAGNOSIS — R1031 Right lower quadrant pain: Secondary | ICD-10-CM | POA: Diagnosis not present

## 2015-01-05 DIAGNOSIS — I10 Essential (primary) hypertension: Secondary | ICD-10-CM | POA: Diagnosis not present

## 2015-01-05 DIAGNOSIS — T466X1D Poisoning by antihyperlipidemic and antiarteriosclerotic drugs, accidental (unintentional), subsequent encounter: Secondary | ICD-10-CM | POA: Diagnosis not present

## 2015-12-22 DIAGNOSIS — E10319 Type 1 diabetes mellitus with unspecified diabetic retinopathy without macular edema: Secondary | ICD-10-CM | POA: Diagnosis not present

## 2015-12-22 DIAGNOSIS — M7500 Adhesive capsulitis of unspecified shoulder: Secondary | ICD-10-CM | POA: Diagnosis not present

## 2015-12-22 DIAGNOSIS — E784 Other hyperlipidemia: Secondary | ICD-10-CM | POA: Diagnosis not present

## 2015-12-22 DIAGNOSIS — Z1389 Encounter for screening for other disorder: Secondary | ICD-10-CM | POA: Diagnosis not present

## 2015-12-22 DIAGNOSIS — Z6835 Body mass index (BMI) 35.0-35.9, adult: Secondary | ICD-10-CM | POA: Diagnosis not present

## 2015-12-22 DIAGNOSIS — R740 Nonspecific elevation of levels of transaminase and lactic acid dehydrogenase [LDH]: Secondary | ICD-10-CM | POA: Diagnosis not present

## 2015-12-22 DIAGNOSIS — E113599 Type 2 diabetes mellitus with proliferative diabetic retinopathy without macular edema, unspecified eye: Secondary | ICD-10-CM | POA: Diagnosis not present

## 2015-12-22 DIAGNOSIS — Z23 Encounter for immunization: Secondary | ICD-10-CM | POA: Diagnosis not present

## 2015-12-22 DIAGNOSIS — E038 Other specified hypothyroidism: Secondary | ICD-10-CM | POA: Diagnosis not present

## 2015-12-22 DIAGNOSIS — F329 Major depressive disorder, single episode, unspecified: Secondary | ICD-10-CM | POA: Diagnosis not present

## 2015-12-22 DIAGNOSIS — I2510 Atherosclerotic heart disease of native coronary artery without angina pectoris: Secondary | ICD-10-CM | POA: Diagnosis not present

## 2015-12-22 DIAGNOSIS — I10 Essential (primary) hypertension: Secondary | ICD-10-CM | POA: Diagnosis not present

## 2016-04-30 DIAGNOSIS — E113599 Type 2 diabetes mellitus with proliferative diabetic retinopathy without macular edema, unspecified eye: Secondary | ICD-10-CM | POA: Diagnosis not present

## 2016-04-30 DIAGNOSIS — I2510 Atherosclerotic heart disease of native coronary artery without angina pectoris: Secondary | ICD-10-CM | POA: Diagnosis not present

## 2016-04-30 DIAGNOSIS — E784 Other hyperlipidemia: Secondary | ICD-10-CM | POA: Diagnosis not present

## 2016-04-30 DIAGNOSIS — E038 Other specified hypothyroidism: Secondary | ICD-10-CM | POA: Diagnosis not present

## 2016-04-30 DIAGNOSIS — Z6835 Body mass index (BMI) 35.0-35.9, adult: Secondary | ICD-10-CM | POA: Diagnosis not present

## 2016-04-30 DIAGNOSIS — F329 Major depressive disorder, single episode, unspecified: Secondary | ICD-10-CM | POA: Diagnosis not present

## 2016-04-30 DIAGNOSIS — E10319 Type 1 diabetes mellitus with unspecified diabetic retinopathy without macular edema: Secondary | ICD-10-CM | POA: Diagnosis not present

## 2016-04-30 DIAGNOSIS — I10 Essential (primary) hypertension: Secondary | ICD-10-CM | POA: Diagnosis not present

## 2016-08-13 DIAGNOSIS — Z794 Long term (current) use of insulin: Secondary | ICD-10-CM | POA: Diagnosis not present

## 2016-08-13 DIAGNOSIS — H524 Presbyopia: Secondary | ICD-10-CM | POA: Diagnosis not present

## 2016-08-13 DIAGNOSIS — H35371 Puckering of macula, right eye: Secondary | ICD-10-CM | POA: Diagnosis not present

## 2016-08-13 DIAGNOSIS — Z970 Presence of artificial eye: Secondary | ICD-10-CM | POA: Diagnosis not present

## 2016-08-13 DIAGNOSIS — H53481 Generalized contraction of visual field, right eye: Secondary | ICD-10-CM | POA: Diagnosis not present

## 2016-08-13 DIAGNOSIS — H52201 Unspecified astigmatism, right eye: Secondary | ICD-10-CM | POA: Diagnosis not present

## 2016-08-13 DIAGNOSIS — E113553 Type 2 diabetes mellitus with stable proliferative diabetic retinopathy, bilateral: Secondary | ICD-10-CM | POA: Diagnosis not present

## 2017-04-18 ENCOUNTER — Encounter (HOSPITAL_COMMUNITY): Payer: Self-pay | Admitting: Emergency Medicine

## 2017-04-18 ENCOUNTER — Other Ambulatory Visit: Payer: Self-pay

## 2017-04-18 ENCOUNTER — Emergency Department (HOSPITAL_COMMUNITY): Payer: Medicare Other

## 2017-04-18 ENCOUNTER — Emergency Department (HOSPITAL_COMMUNITY)
Admission: EM | Admit: 2017-04-18 | Discharge: 2017-04-18 | Disposition: A | Discharge status: Home / Self Care | Payer: Medicare Other | Attending: Emergency Medicine | Admitting: Emergency Medicine

## 2017-04-18 DIAGNOSIS — Z794 Long term (current) use of insulin: Secondary | ICD-10-CM | POA: Insufficient documentation

## 2017-04-18 DIAGNOSIS — R0789 Other chest pain: Secondary | ICD-10-CM | POA: Insufficient documentation

## 2017-04-18 DIAGNOSIS — R51 Headache: Secondary | ICD-10-CM | POA: Diagnosis not present

## 2017-04-18 DIAGNOSIS — Z79899 Other long term (current) drug therapy: Secondary | ICD-10-CM | POA: Diagnosis not present

## 2017-04-18 DIAGNOSIS — S199XXA Unspecified injury of neck, initial encounter: Secondary | ICD-10-CM | POA: Diagnosis present

## 2017-04-18 DIAGNOSIS — Y998 Other external cause status: Secondary | ICD-10-CM | POA: Insufficient documentation

## 2017-04-18 DIAGNOSIS — I10 Essential (primary) hypertension: Secondary | ICD-10-CM | POA: Diagnosis not present

## 2017-04-18 DIAGNOSIS — E039 Hypothyroidism, unspecified: Secondary | ICD-10-CM | POA: Insufficient documentation

## 2017-04-18 DIAGNOSIS — M25532 Pain in left wrist: Secondary | ICD-10-CM | POA: Insufficient documentation

## 2017-04-18 DIAGNOSIS — E119 Type 2 diabetes mellitus without complications: Secondary | ICD-10-CM | POA: Diagnosis not present

## 2017-04-18 DIAGNOSIS — I2510 Atherosclerotic heart disease of native coronary artery without angina pectoris: Secondary | ICD-10-CM | POA: Diagnosis not present

## 2017-04-18 DIAGNOSIS — Y92410 Unspecified street and highway as the place of occurrence of the external cause: Secondary | ICD-10-CM | POA: Diagnosis not present

## 2017-04-18 DIAGNOSIS — Z7982 Long term (current) use of aspirin: Secondary | ICD-10-CM | POA: Insufficient documentation

## 2017-04-18 DIAGNOSIS — S161XXA Strain of muscle, fascia and tendon at neck level, initial encounter: Secondary | ICD-10-CM

## 2017-04-18 DIAGNOSIS — Z951 Presence of aortocoronary bypass graft: Secondary | ICD-10-CM | POA: Insufficient documentation

## 2017-04-18 DIAGNOSIS — Y9389 Activity, other specified: Secondary | ICD-10-CM | POA: Insufficient documentation

## 2017-04-18 LAB — CBG MONITORING, ED: Glucose-Capillary: 64 mg/dL — ABNORMAL LOW (ref 65–99)

## 2017-04-18 MED ORDER — CYCLOBENZAPRINE HCL 10 MG PO TABS: 10.0000 mg | ORAL_TABLET | Freq: Two times a day (BID) | ORAL | 0 refills | Status: AC | PRN

## 2017-04-18 NOTE — ED Triage Notes (Signed)
Pt is drinking regular soda due to low blood sugar
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
## 2017-04-18 NOTE — Discharge Instructions (Signed)
Please read attached information regarding your condition. Take Flexeril along with Tylenol for the next 2-3 days to help with symptoms. Apply heating pad and stretch areas as tolerated.  Massaging the area will also help. Follow-up with your primary care provider for further evaluation. Return to ED for worsening symptoms, severe chest pain, additional injuries or loss of consciousness, numbness in legs.

## 2017-09-02 ENCOUNTER — Telehealth: Payer: Self-pay | Admitting: Cardiology

## 2017-09-02 NOTE — Telephone Encounter (Signed)
Called patient and LVM to call back to schedule OV.  Patient has not been seen since 12-03-14.
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
## 2022-11-29 ENCOUNTER — Emergency Department (HOSPITAL_COMMUNITY)
Admission: EM | Admit: 2022-11-29 | Discharge: 2022-11-29 | Disposition: A | Discharge status: Home / Self Care | Payer: 59 | Attending: Emergency Medicine | Admitting: Emergency Medicine

## 2022-11-29 ENCOUNTER — Emergency Department (HOSPITAL_COMMUNITY): Payer: 59

## 2022-11-29 DIAGNOSIS — U071 COVID-19: Secondary | ICD-10-CM | POA: Insufficient documentation

## 2022-11-29 DIAGNOSIS — Z79899 Other long term (current) drug therapy: Secondary | ICD-10-CM | POA: Insufficient documentation

## 2022-11-29 DIAGNOSIS — Z7982 Long term (current) use of aspirin: Secondary | ICD-10-CM | POA: Diagnosis not present

## 2022-11-29 DIAGNOSIS — Z794 Long term (current) use of insulin: Secondary | ICD-10-CM | POA: Diagnosis not present

## 2022-11-29 DIAGNOSIS — I2510 Atherosclerotic heart disease of native coronary artery without angina pectoris: Secondary | ICD-10-CM | POA: Insufficient documentation

## 2022-11-29 DIAGNOSIS — R519 Headache, unspecified: Secondary | ICD-10-CM | POA: Diagnosis present

## 2022-11-29 DIAGNOSIS — E109 Type 1 diabetes mellitus without complications: Secondary | ICD-10-CM | POA: Diagnosis not present

## 2022-11-29 DIAGNOSIS — I10 Essential (primary) hypertension: Secondary | ICD-10-CM | POA: Insufficient documentation

## 2022-11-29 DIAGNOSIS — E039 Hypothyroidism, unspecified: Secondary | ICD-10-CM | POA: Diagnosis not present

## 2022-11-29 LAB — RESP PANEL BY RT-PCR (RSV, FLU A&B, COVID)  RVPGX2
Influenza A by PCR: NEGATIVE
Influenza B by PCR: NEGATIVE
Resp Syncytial Virus by PCR: NEGATIVE
SARS Coronavirus 2 by RT PCR: POSITIVE — AB

## 2022-11-29 LAB — COMPREHENSIVE METABOLIC PANEL
ALT: 29 U/L (ref 0–44)
AST: 35 U/L (ref 15–41)
Albumin: 3.4 g/dL — ABNORMAL LOW (ref 3.5–5.0)
Alkaline Phosphatase: 97 U/L (ref 38–126)
Anion gap: 13 (ref 5–15)
BUN: 10 mg/dL (ref 6–20)
CO2: 21 mmol/L — ABNORMAL LOW (ref 22–32)
Calcium: 8.4 mg/dL — ABNORMAL LOW (ref 8.9–10.3)
Chloride: 96 mmol/L — ABNORMAL LOW (ref 98–111)
Creatinine, Ser: 1.03 mg/dL (ref 0.61–1.24)
GFR, Estimated: >60 mL/min (ref >60)
Glucose, Bld: 251 mg/dL — ABNORMAL HIGH (ref 70–99)
Potassium: 3.9 mmol/L (ref 3.5–5.1)
Sodium: 130 mmol/L — ABNORMAL LOW (ref 135–145)
Total Bilirubin: 0.8 mg/dL (ref 0.3–1.2)
Total Protein: 6.8 g/dL (ref 6.5–8.1)

## 2022-11-29 LAB — CBC
HCT: 49.4 % (ref 39.0–52.0)
Hemoglobin: 15.9 g/dL (ref 13.0–17.0)
MCH: 28.9 pg (ref 26.0–34.0)
MCHC: 32.2 g/dL (ref 30.0–36.0)
MCV: 89.7 fL (ref 80.0–100.0)
Platelets: 196 10*3/uL (ref 150–400)
RBC: 5.51 MIL/uL (ref 4.22–5.81)
RDW: 13.5 % (ref 11.5–15.5)
WBC: 11.2 10*3/uL — ABNORMAL HIGH (ref 4.0–10.5)
nRBC: 0 % (ref 0.0–0.2)

## 2022-11-29 LAB — CBG MONITORING, ED
Glucose-Capillary: 295 mg/dL — ABNORMAL HIGH (ref 70–99)
Glucose-Capillary: 339 mg/dL — ABNORMAL HIGH (ref 70–99)
Glucose-Capillary: 322 mg/dL — ABNORMAL HIGH (ref 70–99)

## 2022-11-29 MED ORDER — INSULIN ASPART 100 UNIT/ML IJ SOLN
6.0000 [IU] | Freq: Once | INTRAMUSCULAR | Status: AC
  Administered 2022-11-29: 6 [IU] via SUBCUTANEOUS
  Filled 2022-11-29: qty 0.06

## 2022-11-29 MED ORDER — INSULIN ASPART 100 UNIT/ML IJ SOLN
4.0000 [IU] | Freq: Once | INTRAMUSCULAR | Status: AC
  Administered 2022-11-29: 4 [IU] via SUBCUTANEOUS
  Filled 2022-11-29: qty 0.04

## 2022-11-29 MED ORDER — SODIUM CHLORIDE 0.9 % IV BOLUS
1000.0000 mL | Freq: Once | INTRAVENOUS | Status: AC
  Administered 2022-11-29: 1000 mL via INTRAVENOUS

## 2022-11-29 MED ORDER — INSULIN ASPART PROT & ASPART (70-30 MIX) 100 UNIT/ML ~~LOC~~ SUSP
4.0000 [IU] | Freq: Once | SUBCUTANEOUS | Status: DC
Start: 2022-11-29 — End: 2022-11-29

## 2022-11-29 MED ORDER — ACETAMINOPHEN 500 MG PO TABS
1000.0000 mg | ORAL_TABLET | Freq: Once | ORAL | Status: AC
  Administered 2022-11-29: 1000 mg via ORAL
  Filled 2022-11-29: qty 2

## 2022-11-29 MED ORDER — ONDANSETRON HCL 4 MG/2ML IJ SOLN
4.0000 mg | Freq: Once | INTRAMUSCULAR | Status: AC
  Administered 2022-11-29: 4 mg via INTRAVENOUS
  Filled 2022-11-29: qty 2

## 2022-11-29 NOTE — Discharge Instructions (Addendum)
Thank you for allowing [place] to be a part of your care today.  You tested positive for COVID, which is the likely cause of your symptoms.  I recommend continuing to use Robitussin as needed for cough.  Take Zyrtec 10 mg daily to help with congestion.  Schedule a follow up appointment with your primary care doctor.  Monitor your blood sugar closely at home.  Take your medications as prescribed, including your nighttime insulin.  Return to the ED if you develop sudden worsening of your symptoms or if you have any new concerns.

## 2022-11-29 NOTE — ED Notes (Signed)
Specimen not collected, pt aware of UA. Will re-attempt later

## 2022-11-29 NOTE — ED Triage Notes (Signed)
Patient here from home reporting covid exposure by family  states that over the past week reporting n/v/d, cough, headaches, chills, fever.

## 2022-11-29 NOTE — ED Provider Notes (Signed)
[facility] AT [facility] Provider Note   CSN: [id number] Arrival date & time: 11/29/22  1108     History  Chief Complaint  Patient presents with   Covid Exposure    [name] is a 62 y.o. male with past medical history significant for CAD, dyslipidemia, hypertension, hypothyroidism, type 1 diabetes presents to the ED complaining of nausea, diarrhea, cough, headache, body aches, chills and fever.  Patient states he has recent COVID exposure by family.  He states his symptoms have stayed relatively the same since they started.  He does have some shortness of breath with activity.  Denies diarrhea, abdominal pain, chest pain, syncope.       Home Medications Prior to Admission medications   Medication Sig Start Date End Date Taking? Authorizing Provider  aspirin 81 MG tablet Take 81 mg by mouth daily.    [provider]  atorvastatin (LIPITOR) 80 MG tablet Take 80 mg by mouth daily.    [provider]  B-D UF III MINI PEN NEEDLES 31G X 5 MM MISC  01/03/12   [provider]  BENAZEPRIL HCL PO Take 10 mg by mouth daily.    [provider]  buPROPion (WELLBUTRIN XL) 300 MG 24 hr tablet Take 300 mg by mouth 2 (two) times daily. TAKE TWO TABLETS TWICE DAILY    [provider]  cyclobenzaprine (FLEXERIL) 10 MG tablet Take 1 tablet (10 mg total) by mouth 2 (two) times daily as needed for muscle spasms. 04/18/17   Khatri, Hina, PA-C  gabapentin (NEURONTIN) 600 MG tablet Take 600 mg by mouth 2 (two) times daily. TAKE 2 TABLETS IN THE MORNING AND 2 TABLETS AT NIGHT    [provider]  HUMALOG KWIKPEN 100 UNIT/ML injection  01/28/12   [provider]  insulin glargine (LANTUS) 100 UNIT/ML injection Inject into the skin as directed. 26 UNITS IN THE MORNING AND 20 UNITS IN THE EVENING    [provider]  LEVEMIR FLEXPEN 100 UNIT/ML injection  12/07/11   [provider]  levothyroxine  (SYNTHROID, LEVOTHROID) 125 MCG tablet Take 125 mcg by mouth daily.    [provider]  METOPROLOL TARTRATE PO Take 50 mg by mouth 2 (two) times daily.    [provider]  Multiple Vitamins-Minerals (CENTRUM SILVER PO) Take by mouth daily.    [provider]      Allergies    Ciprofloxacin and Penicillins    Review of Systems   Review of Systems  Constitutional:  Positive for chills and fever.  Respiratory:  Positive for cough and shortness of breath.   Cardiovascular:  Negative for chest pain.  Gastrointestinal:  Positive for nausea. Negative for abdominal pain, diarrhea and vomiting.    Physical Exam Updated Vital Signs BP 125/85   Pulse (!) 102   Temp 98.4 F (36.9 C)   Resp 18   SpO2 97%  Physical Exam Vitals and nursing note reviewed.  Constitutional:      General: He is not in acute distress.    Appearance: Normal appearance. He is not ill-appearing or diaphoretic.  Cardiovascular:     Rate and Rhythm: Regular rhythm. Tachycardia present.     Heart sounds: Normal heart sounds.  Pulmonary:     Effort: Pulmonary effort is normal. No tachypnea or respiratory distress.     Breath sounds: Normal breath sounds and air entry.     Comments: Patient speaking in full sentences.  Cough appreciated.  Abdominal:     General: Abdomen is flat.     Palpations: Abdomen is soft.     Tenderness: There is no abdominal tenderness.  Musculoskeletal:     Right lower leg: No edema.     Left lower leg: No edema.  Skin:    General: Skin is warm and dry.     Capillary Refill: Capillary refill takes less than 2 seconds.  Neurological:     Mental Status: He is alert. Mental status is at baseline.  Psychiatric:        Mood and Affect: Mood normal.        Behavior: Behavior normal.     ED Results / Procedures / Treatments   Labs (all labs ordered are listed, but only abnormal results are displayed) Labs Reviewed  RESP PANEL BY RT-PCR (RSV, FLU A&B, COVID)   RVPGX2 - Abnormal; Notable for the following components:      Result Value   SARS Coronavirus 2 by RT PCR POSITIVE (*)    All other components within normal limits  COMPREHENSIVE METABOLIC PANEL - Abnormal; Notable for the following components:   Sodium 130 (*)    Chloride 96 (*)    CO2 21 (*)    Glucose, Bld 251 (*)    Calcium 8.4 (*)    Albumin 3.4 (*)    All other components within normal limits  CBC - Abnormal; Notable for the following components:   WBC 11.2 (*)    All other components within normal limits  CBG MONITORING, ED - Abnormal; Notable for the following components:   Glucose-Capillary 295 (*)    All other components within normal limits  CBG MONITORING, ED - Abnormal; Notable for the following components:   Glucose-Capillary 339 (*)    All other components within normal limits  CBG MONITORING, ED - Abnormal; Notable for the following components:   Glucose-Capillary 322 (*)    All other components within normal limits    EKG None  Radiology DG Chest 2 View  Result Date: 11/29/2022 CLINICAL DATA:  Cough.  COVID positive EXAM: CHEST - 2 VIEW COMPARISON:  X-ray 04/18/2017 FINDINGS: Enlarged cardiopericardial silhouette with vascular congestion. Small left effusion. Sternal wires. No pneumothorax. Left midlung lateral scar or atelectasis. No separate consolidation. Degenerative changes of the spine. IMPRESSION: Postop chest with enlarged cardiac silhouette with vascular congestion and small left effusion. Left midlung scar or atelectasis. Electronically Signed   By: Karen Kays M.D.   On: 11/29/2022 16:30    Procedures Procedures    Medications Ordered in ED Medications  ondansetron (ZOFRAN) injection 4 mg (4 mg Intravenous Given 11/29/22 1331)  sodium chloride 0.9 % bolus 1,000 mL (0 mLs Intravenous Stopped 11/29/22 1526)  acetaminophen (TYLENOL) tablet 1,000 mg (1,000 mg Oral Given 11/29/22 1332)  insulin aspart (novoLOG) injection 6 Units (6 Units Subcutaneous  Given 11/29/22 1716)  sodium chloride 0.9 % bolus 1,000 mL (0 mLs Intravenous Stopped 11/29/22 2039)  insulin aspart (novoLOG) injection 4 Units (4 Units Subcutaneous Given 11/29/22 1838)    ED Course/ Medical Decision Making/ A&P                                 Medical Decision Making Amount and/or Complexity of Data Reviewed Labs: ordered. Radiology: ordered.  Risk OTC drugs. Prescription drug management.   This patient presents to the ED with chief complaint(s) of flu-like symptoms with pertinent past medical history  of diabetes, hypertension, hypothyroidism.  The complaint involves an extensive differential diagnosis and also carries with it a high risk of complications and morbidity.    The differential diagnosis includes COVID, RSV, influenza, adenovirus, other viral etiology   The initial plan is to obtain labs, resp panel  Initial Assessment:   On exam, patient is alert, oriented and not in acute respiratory distress.  Lungs are clear to auscultation bilaterally with adequate tidal volume.  Cough appreciated during exam.  Patient is speaking in full sentences.  Heart rate is tachycardic around 108 with regular rhythm.  Abdomen is soft and non tender to palpation.  Skin is warm and dry.    Independent ECG/labs interpretation:  The following labs were independently interpreted:  COVID+.  CBC with leukocytosis.  Metabolic panel with hypoglycemia.  Normal hepatic and renal function.    Independent visualization and interpretation of imaging: I independently visualized the following imaging with scope of interpretation limited to determining acute life threatening conditions related to emergency care: chest x-ray, which revealed small left pleural effusion and vascular congestion.  No infiltrate to suggest pneumonia.    Treatment and Reassessment: Patient given IV fluids, Tylenol, and Zofran with improvement in symptoms.  Patient continues to have cough, but no shortness of  breath.  Patient ambulated and maintained SPO2.  He was given water which he was able to tolerate without vomiting.   CBG recheck 295.  Patient given 6 units insulin and blood sugar rechecked.  Patient reports he has not taken his insulin yet today.    Upon recheck, patient's blood sugar has increased to 339.  Will give another fluid bolus and 4 units of insulin.  CBG downtrending to 322.  Patient has not had his daily insulin which is 26 units in AM and 20 units in the evening.  Patient HR improved to 102.  He reports feeling better and is requesting discharge home.    Disposition:   Patient's symptoms likely secondary to COVID.  He is outside window for Paxlovid.  Advised patient to follow up with PCP if he has persistent symptoms.  Discussed supportive care measures including over the counter medications for cough and congestion.  Advised patient to take his medications as prescribed and monitor his blood sugar closely at home.  The patient has been appropriately medically screened and/or stabilized in the ED. I have low suspicion for any other emergent medical condition which would require further screening, evaluation or treatment in the ED or require inpatient management. At time of discharge the patient is hemodynamically stable and in no acute distress. I have discussed work-up results and diagnosis with patient and answered all questions. Patient is agreeable with discharge plan. We discussed strict return precautions for returning to the emergency department and they verbalized understanding.            Final Clinical Impression(s) / ED Diagnoses Final diagnoses:  COVID-19    Rx / DC Orders ED Discharge Orders     None         [name] 11/29/22 2120    Benjiman Core, MD 12/03/22 [phone]
